# Patient Record
Sex: Female | Born: 1965 | Race: White | Hispanic: No
Health system: Midwestern US, Academic
[De-identification: ages and names within clinical notes are randomized; demographics above are authoritative.]

---

## 2016-10-21 ENCOUNTER — Encounter: Admit: 2016-10-21 | Discharge: 2016-10-21

## 2016-11-11 ENCOUNTER — Encounter: Admit: 2016-11-11 | Discharge: 2016-11-11

## 2016-11-11 ENCOUNTER — Ambulatory Visit: Admit: 2016-11-11 | Discharge: 2016-11-12

## 2016-11-11 DIAGNOSIS — J45909 Unspecified asthma, uncomplicated: Principal | ICD-10-CM

## 2016-11-11 DIAGNOSIS — R413 Other amnesia: ICD-10-CM

## 2016-11-11 DIAGNOSIS — I499 Cardiac arrhythmia, unspecified: ICD-10-CM

## 2016-11-11 DIAGNOSIS — O039 Complete or unspecified spontaneous abortion without complication: ICD-10-CM

## 2016-11-11 DIAGNOSIS — G43909 Migraine, unspecified, not intractable, without status migrainosus: ICD-10-CM

## 2016-11-11 DIAGNOSIS — M1990 Unspecified osteoarthritis, unspecified site: ICD-10-CM

## 2016-11-11 DIAGNOSIS — G479 Sleep disorder, unspecified: ICD-10-CM

## 2016-11-11 DIAGNOSIS — R079 Chest pain, unspecified: ICD-10-CM

## 2016-11-11 DIAGNOSIS — R1319 Other dysphagia: ICD-10-CM

## 2016-11-11 MED ORDER — TOPIRAMATE 25 MG PO TAB
ORAL_TABLET | Freq: Two times a day (BID) | 5 refills | Status: AC
Start: 2016-11-11 — End: 2017-05-05
  Filled 2016-11-11: qty 60, 20d supply

## 2016-11-11 NOTE — Progress Notes
Date of Service: 11/11/2016    Subjective:             Ragina Fenter is a 51 y.o. female with PMHx of sinusitis, chronic migraines, asthma, GERD, ovarian cysts, chronic pain who is a new patient at the [facility] neurology clinic for evaluation of chronic migraines.     History of Present Illness  Patient reports having headaches since teenage years, that started with mild-moderate headaches, and has progressively worsened to daily debilitating headaches. Patient describes them as squeezing pain, wrapped around her head that starts around 11 am and progressively gets worse. Patient reports associated nausea and vomiting from time to time, along with neck pain. Patient has noticed worsening symptoms with increased stress and decreased exercise. Patient has tried a number of different migraine medications, including amitriptyline, imitrex, topamax with no significant improvement in headaches. Patient has also tried nerve blocks and botox with no relief.        Review of Systems   Constitutional: Negative.    Neurological: Positive for headaches.       Objective:         ??? amitriptyline (ELAVIL) 100 mg tablet Take 200 mg by mouth at bedtime as needed.   ??? duloxetine DR (CYMBALTA) 30 mg capsule Take 30 mg by mouth twice daily.   ??? gabapentin (NEURONTIN) 600 mg tablet Take 600 mg by mouth three times daily.   ??? naproxen (NAPROSYN) 500 mg tablet Take 500 mg by mouth as Needed for Pain. Take with food.   ??? other medication Take 1 Dose by mouth twice daily. Medication Name: Sterile Compounded Capsules     Ingredients per Capsule:   Methylcobalamin 1mg    Pyridoxal-5-Phosphate 12.5mg   5-methyltetrahydrofolate 1mg   Riboflavin-5-Phosphate 200mg   Co Q-10 150mg   Magnesium Glycinate 300mg   Vitamin D3 2500 IU   ??? traMADol (ULTRAM) 50 mg tablet Take 50 mg by mouth every 12 hours as needed for Pain.     Vitals:    11/11/16 0910   BP: 105/70   Pulse: 93   Resp: 16   Weight: 65.1 kg (143 lb 8 oz)   Height: 160 cm (63) Body mass index is 25.42 kg/m???.     Physical Exam   Constitutional: She is oriented to person, place, and time. She appears well-developed and well-nourished.   HENT:   Head: Normocephalic and atraumatic.   Eyes: EOM are normal. Pupils are equal, round, and reactive to light.   With horizontal nystagmus    Neck: Normal range of motion.   Cardiovascular: Normal rate and regular rhythm.    Pulmonary/Chest: Effort normal.   Neurological: She is alert and oriented to person, place, and time. She has normal strength.   Reflex Scores:       Tricep reflexes are 2+ on the right side and 2+ on the left side.       Bicep reflexes are 2+ on the right side and 2+ on the left side.       Brachioradialis reflexes are 2+ on the right side and 2+ on the left side.       Patellar reflexes are 2+ on the right side and 2+ on the left side.  Finger to nose normal        Assessment and Plan:  Emil Klassen is a 51 y.o. female with PMHx of sinusitis, chronic migraines, asthma, GERD, ovarian cysts, chronic pain who is a new patient at the [facility] neurology clinic for evaluation of chronic migraines.     #  Chronic migraines  - likely tension headaches with migranous component   - mentions improvement in symptoms with CPAP, but still not able to be headache free  - continue amitriptyline 150 mg QHS   - add topiramate 25 mg BID for one week, 50 mg qAM, 25 mg qPM for one week, and then 50 mg BID after that  - relaxation techniques to help reduce stress    - f/u in 3 months     Patient seen and discussed with Dr. Genevie Cheshire

## 2016-11-11 NOTE — Progress Notes
Date of Service: 11/11/2016    Subjective:             Penny Townsend is a 51 y.o. female.    History of Present Illness       Review of Systems   Constitutional: Positive for fatigue.   HENT: Positive for congestion, ear pain, hearing loss, postnasal drip, sinus pain, sneezing, sore throat, trouble swallowing and voice change.    Eyes: Positive for photophobia, pain and itching.   Respiratory: Positive for apnea, cough, choking, chest tightness, shortness of breath and wheezing.    Cardiovascular: Positive for chest pain and palpitations.   Gastrointestinal: Positive for abdominal pain and vomiting.   Genitourinary: Positive for flank pain.   Musculoskeletal: Positive for back pain, myalgias, neck pain and neck stiffness.   Neurological: Positive for dizziness, speech difficulty, light-headedness and headaches.   Psychiatric/Behavioral: Positive for confusion and decreased concentration. The patient is nervous/anxious.    All other systems reviewed and are negative.        Objective:         ??? amitriptyline (ELAVIL) 100 mg tablet Take 200 mg by mouth at bedtime as needed.   ??? diazePAM (VALIUM) 10 mg tablet Take 10 mg by mouth as Needed.   ??? duloxetine DR (CYMBALTA) 30 mg capsule Take 30 mg by mouth twice daily.   ??? fexofenadine(+) (ALLEGRA) 180 mg tablet Take 180 mg by mouth daily.   ??? fluticasone (FLONASE) 50 mcg/actuation nasal spray Daily   ??? gabapentin (NEURONTIN) 600 mg tablet Take 600 mg by mouth three times daily.   ??? ketorolac (TORADOL) 10 mg tablet Take 10 mg by mouth as Needed.   ??? naproxen (NAPROSYN) 500 mg tablet Take 500 mg by mouth as Needed for Pain. Take with food.   ??? other medication Take 1 Dose by mouth twice daily. Medication Name: Sterile Compounded Capsules     Ingredients per Capsule:   Methylcobalamin 1mg    Pyridoxal-5-Phosphate 12.5mg   5-methyltetrahydrofolate 1mg   Riboflavin-5-Phosphate 200mg   Co Q-10 150mg   Magnesium Glycinate 300mg   Vitamin D3 2500 IU ??? traMADol (ULTRAM) 50 mg tablet Take 50 mg by mouth every 12 hours as needed for Pain.     Vitals:    11/11/16 0910   BP: 105/70   Pulse: 93   Resp: 16   Weight: 65.1 kg (143 lb 8 oz)   Height: 160 cm (63)     Body mass index is 25.42 kg/m???.     Physical Exam         Assessment and Plan:

## 2016-11-12 DIAGNOSIS — G43009 Migraine without aura, not intractable, without status migrainosus: Principal | ICD-10-CM

## 2016-11-12 NOTE — Progress Notes
I have personally interviewed and examined Penny Townsend and reviewed the history, examination, impression, and plan of care as outlined by Kateri Plummerr.Shweta Goswami, MD. I personally  participated in patient counseling and coordination of care.  I agree with the assessment and plan as documented by Dr. Fran LowesShweta Goswami, MD.

## 2016-11-22 ENCOUNTER — Encounter: Admit: 2016-11-22 | Discharge: 2016-11-22

## 2017-03-19 ENCOUNTER — Emergency Department
Admit: 2017-03-19 | Discharge: 2017-03-19 | Discharge status: Against Medical Advice | Payer: Commercial Managed Care - PPO

## 2017-03-19 ENCOUNTER — Encounter: Admit: 2017-03-19 | Discharge: 2017-03-19

## 2017-03-19 DIAGNOSIS — G43909 Migraine, unspecified, not intractable, without status migrainosus: ICD-10-CM

## 2017-03-19 DIAGNOSIS — O039 Complete or unspecified spontaneous abortion without complication: ICD-10-CM

## 2017-03-19 DIAGNOSIS — J45909 Unspecified asthma, uncomplicated: Principal | ICD-10-CM

## 2017-03-19 DIAGNOSIS — R413 Other amnesia: ICD-10-CM

## 2017-03-19 DIAGNOSIS — G479 Sleep disorder, unspecified: ICD-10-CM

## 2017-03-19 DIAGNOSIS — I499 Cardiac arrhythmia, unspecified: ICD-10-CM

## 2017-03-19 DIAGNOSIS — Z5321 Procedure and treatment not carried out due to patient leaving prior to being seen by health care provider: Principal | ICD-10-CM

## 2017-03-19 DIAGNOSIS — R1319 Other dysphagia: ICD-10-CM

## 2017-03-19 DIAGNOSIS — R079 Chest pain, unspecified: ICD-10-CM

## 2017-03-19 DIAGNOSIS — M1990 Unspecified osteoarthritis, unspecified site: ICD-10-CM

## 2017-03-21 ENCOUNTER — Encounter: Admit: 2017-03-21 | Discharge: 2017-03-21

## 2017-03-21 DIAGNOSIS — R0781 Pleurodynia: ICD-10-CM

## 2017-03-21 DIAGNOSIS — R079 Chest pain, unspecified: Principal | ICD-10-CM

## 2017-03-21 NOTE — Telephone Encounter
Patient called today noting that she has had chest pain and "burning" in her rib cage for a few weeks.  She saw her PCP 3 weeks ago approximately and she notes that she received very little direction at that time. No CXR was performed at that time.  She presented to the [place] ED on Saturday 11/24 but after a 2 hour wait left prior to being seen.  Discussed with Dr. Brown HumanSpikes who would like pt to get a CXR.  Patient amenable and will [facility] to [facility].    Order faxed to [phone]

## 2017-03-22 ENCOUNTER — Encounter: Admit: 2017-03-22 | Discharge: 2017-03-22

## 2017-03-22 NOTE — Telephone Encounter
Per Dr. Brown HumanSpikes, patient's CXR is negative and patient should schedule a f/u appt with her cardiologist as well as she needs an echocardiogram for her symptoms.    LVM @ 1446 for patient with request to call back to discuss

## 2017-03-23 ENCOUNTER — Encounter: Admit: 2017-03-23 | Discharge: 2017-03-23

## 2017-03-23 NOTE — Telephone Encounter
Notified patient of Dr. Nance Pew' recomemndations, patient v/u    LVM @ 1007 for [place], with Dr. Starleen Blue in [facility] to discuss patient

## 2017-03-23 NOTE — Telephone Encounter
Spoke with Arrie Aran, Dr. Kendrick Ranch nurse at [facility]; advised Dawn of Dr. Nance Pew' recommendation of a follow up appt with Dr. Starleen Blue and he may need/want a possible echo.  Dawn v/u and will relay the message.

## 2017-03-28 ENCOUNTER — Ambulatory Visit: Admit: 2017-03-28 | Discharge: 2017-03-28

## 2017-03-28 ENCOUNTER — Ambulatory Visit: Admit: 2017-03-28 | Discharge: 2017-03-28 | Payer: Commercial Managed Care - PPO

## 2017-03-28 ENCOUNTER — Encounter: Admit: 2017-03-28 | Discharge: 2017-03-28

## 2017-03-28 DIAGNOSIS — I313 Pericardial effusion (noninflammatory): Principal | ICD-10-CM

## 2017-03-28 DIAGNOSIS — R413 Other amnesia: ICD-10-CM

## 2017-03-28 DIAGNOSIS — R079 Chest pain, unspecified: ICD-10-CM

## 2017-03-28 DIAGNOSIS — R1319 Other dysphagia: ICD-10-CM

## 2017-03-28 DIAGNOSIS — J45909 Unspecified asthma, uncomplicated: Principal | ICD-10-CM

## 2017-03-28 DIAGNOSIS — G43909 Migraine, unspecified, not intractable, without status migrainosus: ICD-10-CM

## 2017-03-28 DIAGNOSIS — O039 Complete or unspecified spontaneous abortion without complication: ICD-10-CM

## 2017-03-28 DIAGNOSIS — I300 Acute nonspecific idiopathic pericarditis: ICD-10-CM

## 2017-03-28 DIAGNOSIS — I499 Cardiac arrhythmia, unspecified: ICD-10-CM

## 2017-03-28 DIAGNOSIS — G479 Sleep disorder, unspecified: ICD-10-CM

## 2017-03-28 DIAGNOSIS — M1990 Unspecified osteoarthritis, unspecified site: ICD-10-CM

## 2017-03-28 LAB — CBC
Lab: 3.9 M/UL — ABNORMAL LOW (ref 4.0–5.0)
Lab: 310 10*3/uL (ref 150–400)
Lab: 7.5 FL (ref 7–11)
Lab: 7.7 10*3/uL — ABNORMAL LOW (ref 4.5–11.0)

## 2017-03-28 LAB — LIPID PROFILE
Lab: 122 mg/dL — ABNORMAL HIGH (ref <100)
Lab: 14 mg/dL (ref 32.0–36.0)
Lab: 141 mg/dL (ref 11–15)
Lab: 222 mg/dL — ABNORMAL HIGH (ref <200)
Lab: 68 mg/dL (ref <150)
Lab: 81 mg/dL — ABNORMAL HIGH (ref >40)

## 2017-03-28 NOTE — Progress Notes
Date of Service: 03/28/2017    Penny Townsend is a 51 y.o. female.       HPI      Penny Townsend presents today for evaluation at the request of her pulmonologist Dr. Verlon Au spikes who received a phone call about Penny Townsend having chest pain.     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  She had an extensive immunologic workup which was negative.  She was hospitalized for volume overload in December 2017 at [facility].  At the end of that hospitalization she had a stress test that showed no residual effusion and no evidence of ischemia with limited exercise tolerance due to shortness of breath.      The key elements of that stress test are as follows:  Stress Echo12.22. 2017: Baseline Echo,1.  Normal left ventricular function, EF 55%,2.  Normal chamber dimensions,3.  Valve structure and function are normal,4.  Normal diastolic functionStress ECG,1.  Limited exercise due to patient being short of breath2.  Subtle T wave inversions in the inferior leads with no ST segment changes 3.  No significant atrial or ventricular ectopy during rest or stress4.  Nondiagnostic - submaximal stress ECG, Post Exercise Echo1.  Hyperdynamic left ventricular response to the level of exercise2.  Ejection fraction increases with exercise3.  Left ventricular volume decreased4.  No new regional wall motion abnormalities details of the performance include a 2-minute 43-second exercise time attaining 79% of age corrected target heart rate with a peak of 132    In the next month after she was treated for volume overload she became dehydrated and was readmitted on April 26, 2016 with fluid readministration resolving her issue.  Since then she has made one trip to the emergency room.  That was actually on November 24 when she came to [place] at her husband's insistence because of burning pain in her ribs.  She waited 2-1/2 hours and then decided to leave because she did not think the acuity of her illness was great enough to warrant protracted waiting.  She did not even get checked in enough with her rib pain to have an electrocardiogram.  She did see her primary care physician Dr. Dionne Milo on the following Monday.  She tells me that a chest x-ray was done as well as an EKG.  She heard nothing about either 1 of these and presumes that both were unremarkable.      Penny Townsend actually has several types of chest pain.  She went in to the emergency room because of the more severe pain which is bilaterally symmetric and involves the lower third of her rib cage in the mid axillary line.  Some of this may change with breathing.  This is fairly severe sharp pain she also has some discomfort in the midline of her lower anterior chest that may be more noticeable when she lays down.  She noticed a little bit of this when I had her lay flat for exam today.    The third type of chest discomfort she has is the one that is most disturbing to me with regard to possible coronary disease she describes this as a burning sensation bilaterally across the upper third of her chest radiating evenly from the sternal region toward the shoulders.  It occurs fairly predictably with physical activity and lasts long enough to make her stop or slow down from what she is doing.  The first episode was several weeks ago when  she was carrying something up the stairs.  She states that she was not carrying anything particularly heavy.  This discomfort has continued principally with exertion.  She has not had any dramatically prolonged or more severe episodes within the last week.    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  She has had an extensive immunologic panel run with no diagnostic findings.  In the absence of a diagnosis she does not have follow-up scheduled with him.  She is not had symptoms of serositis she is having no fevers chills or sweats not having hemoptysis.  Her medications are stable.  She is on no cardioactive medications at this time.  When I have seen her in the past we addressed issues related to unexplained low-grade sinus tachycardia.  She is been treated with propranolol for recurrent headaches which I thought was suppressing her heart rate in the might be reasonable can medication to continue.  However she is not on the propranolol at this time.  The resting heart rate she has today 107 does not surprise her at all.         Vitals:    03/28/17 0855   BP: 102/70   Pulse: 107   Weight: 68 kg (150 lb)   Height: 1.651 m (5' 5)     Body mass index is 24.96 kg/m???.     Past Medical History  Patient Active Problem List    Diagnosis Date Noted   ??? Migraine headache 11/11/2016   ??? Migraine without aura and without status migrainosus, not intractable 07/06/2016     Dx's ~21002 then followed by Dr. Adair Laundry with Propranolol.      ??? 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 HAP (hospital-acquired pneumonia) 04/10/2016   ??? Generalized edema 04/10/2016   ??? Normocytic anemia 04/10/2016   ??? Thrombocytosis (HCC) 04/10/2016   ??? Bilateral pleural effusion 04/10/2016   ??? Constipation 04/10/2016   ??? Pericardial effusion 04/10/2016         Review of Systems   Constitution: Negative.   HENT: Negative.    Eyes: Negative.    Cardiovascular: Positive for chest pain (a little) and dyspnea on exertion.   Respiratory: Positive for shortness of breath.    Endocrine: Negative.    Hematologic/Lymphatic: Negative.    Skin: Negative.    Musculoskeletal: Negative.    Gastrointestinal: Negative.    Genitourinary: Negative.    Neurological: Negative.    Psychiatric/Behavioral: Negative.    Allergic/Immunologic: Negative.    All other systems reviewed and are negative.  14 organ system review noted. It is negative except as reported in current narrative or above in the ROS section. This is a patient centered review of systems that was stated by the patient in her terms prior to my personal problem oriented interview with the patient     Physical Exam  General: Patient in no distress, looks generally healthy. Skin warm and dry.    Mucous membranes moist.  Eyes: Sclera non icteric,Pupils equal and round    Carotids: no bruits    Thyroid not enlarged.  Neck veins: CVP <6 normal, no V wave, no HJR     Respiratory: Breathing comfortably. Lungs clear to percussion & auscultation. No rales, rhonchi or wheezing   Cardiac: Regular rhythm. LV impulse not palpable. Normal S1 & S2, Fourth heart sound, no rub or S3. No murmur  Abdomen: soft, non-tender, no  masses,bruits,hepatic or aortic enlargement. + bowel sounds.   Femoral arteries: Good pulses, no bruits.  Legs/feet: Normal PT pulses, no edema.   Motor: Normal muscle strength. Cognitive: Pleasant demeanor. Good insight. No depression     Cardiovascular Studies  EKG today shows sinus tachycardia with less than 1/65mm horizontal and slightly upsloping ST depression in the inferior and lateral leads that is nonspecific.  I reviewed all her tracings on file.  Her last tracing showed ST depression similar to what was seen today.  Other tracings show some nonspecific T inversion rather than ST segment depression.  All of these findings are nonspecific.  Problems Addressed Today  No diagnosis found.    Assessment and Plan     Penny Townsend has multiple types of chest discomfort but 1 of them is characteristic enough of angina to warrant stress testing.  Even though she had a benign stress test last December she only exercised 2 minutes 43 seconds reaching 79% of her age corrected target heart rate.  With issues relating to possible recurrent pericardial effusion and baseline ST and T wave changes I think she needs another treadmill exercise echo.  I am going to let her do a warmup before she moves into the Bruce protocol so she will have a modified Bruce protocol treadmill echo.  I would like her to do this at our [place] office where I have ready access to the images and the report.  I had like to get this done fairly expeditiously although it is not an emergency.  I talked to her about her exercise duration last year she thought that she could do much better this year.  Nevertheless I still want her to do a modified Bruce protocol so she has some time to warm up before hitting the more challenging components of the stress protocol.    The echo will help make sure that we do not have pericardial effusion.  Her lungs are clear.  I do not think she has a pleural effusion by exam.  She had a chest x-ray last week in [place] when she saw Dr. Herschell Dimes so I am confident that there is nothing to be gained by having another chest x-ray.    I do not think she needs further immunologic evaluation particularly under my direction.    I actually would be surprised if she does have coronary disease evident on her stress echo given her age of 52 but we need to make sure since she does have symptoms of exertion triggered   upper anterior chest pressure that are suggestive of angina.    I told her she can have this done in [place] at her convenience.  She will return to have the modified Bruce stress echo without Doppler done in our  office sometime in the next week or so.    I am not starting her on any medication as I am not certain of the diagnosis.    Blood pressure looks good.  Heart rate is a little high as it has been in the past.  At this point I do not think there is any way clearly should be doing.  You had your thyroid checked multiple times sure.    She has a history of pernicious anemia.  I want her to get a CBC as well as a fasting lipid profile in order to get a better assessment of her potential causes for resting tachycardia. She is due for a cholesterol profile.  She has had minimal amount  to eat today.  I think I would be better off just checking a lipid profile today as well as a CBC to be certain that she is not anemic as a cause for resting tachycardia or as a contributor to chest discomfort syndrome.   Lab work I ordered as a result of today's visit has returned and is pasted below.  Her hemoglobin is satisfactory eliminating anemia as a cause of her symptoms.  Her LDL is elevated at 122 but I am not sure this warrants therapy given her overall risk profile including the fact that she is a 51 year old woman.   03/28/2017 10:24   Hemoglobin 13.1   Hematocrit 39.0   Platelet Count 310   White Blood Cells 7.7   RBC 3.92 (L)   MCV 99.6   MCH 33.5   MCHC 33.6   MPV 7.5   RDW 13.5   Cholesterol 222 (H)   Triglycerides 68   HDL 81   LDL 122 (H)       NB: The free text in this document was generated through Dragon(TM) software with editing and proofreading  done by the author of this document Dr. Mable Paris MD, [facility] principally at the point of care. Some errors may persist.  If there are questions about content in this document please contact Dr. Hale Bogus.    The written information I provided Penny Townsend at the conclusion of today's encounter is as  follows:    Patient Instructions   Even though you had a treadmill stress test last year your symptoms of chest pressure with exertion are concerning enough that I think you need to have a repeat stress echo.  There is a possibility that pericardial effusion has returned and is explaining some of your chest symptoms that are not related to exertion.  I think the best thing for you is to have a modified Bruce stress echo study without Doppler because we will be able to compare it to the study you had a year ago.    Can start you on any medication today since I am really not sure what you have.  If your chest pain worsens dramatically between now and the time he has a stress test he probably need to go the emergency room in [place] or possibly [place] depending on the situation.  Another waiting time at [facility] is almost always going to be less than here. Blood testing today: CBC and fasting cholesterol profile today.  I realize you are not quite fasting but I think at this point is better to get the results and know what we are basically dealing with.  There is a segment of research that suggests that nonfasting cholesterol profile is as good as a fasting so your situation I think is reasonable to check today    It's good to see you today.   I will do my best to contact you after the stress test.  The thing I have the greatest trouble with his looking up results when they are just fed into the computer and is up to me to track them down    If you do not hear anything about your stress test result within a week use My Chart to send a message to me requesting results.               Current Medications (including today's revisions)  ??? amitriptyline (ELAVIL) 100 mg tablet Take 200 mg by mouth at bedtime as needed.   ???  diazePAM (VALIUM) 10 mg tablet Take 10 mg by mouth as Needed.   ??? duloxetine DR (CYMBALTA) 30 mg capsule Take 30 mg by mouth twice daily.   ??? fexofenadine(+) (ALLEGRA) 180 mg tablet Take 180 mg by mouth daily.   ??? fluticasone (FLONASE) 50 mcg/actuation nasal spray Daily   ??? gabapentin (NEURONTIN) 600 mg tablet Take 600 mg by mouth three times daily.   ??? ketorolac (TORADOL) 10 mg tablet Take 10 mg by mouth as Needed.   ??? naproxen (NAPROSYN) 500 mg tablet Take 500 mg by mouth as Needed for Pain. Take with food.   ??? other medication Take 1 Dose by mouth twice daily. Medication Name: Sterile Compounded Capsules     Ingredients per Capsule:   Methylcobalamin 1mg    Pyridoxal-5-Phosphate 12.5mg   5-methyltetrahydrofolate 1mg   Riboflavin-5-Phosphate 200mg   Co Q-10 150mg   Magnesium Glycinate 300mg   Vitamin D3 2500 IU   ??? topiramate (TOPAMAX) 25 mg tablet Take 25 mg tablet two times a day for one week, then 50 mg (two tabs) in the morning and 25 mg (one tab) in the evening for one week, then 50 mg (two tabs) in the morning and 50 mg (two tabs) in the evening ??? traMADol (ULTRAM) 50 mg tablet Take 50 mg by mouth every 12 hours as needed for Pain.

## 2017-03-31 ENCOUNTER — Encounter: Admit: 2017-03-31 | Discharge: 2017-03-31

## 2017-04-01 ENCOUNTER — Encounter: Admit: 2017-04-01 | Discharge: 2017-04-01

## 2017-04-28 ENCOUNTER — Encounter: Admit: 2017-04-28 | Discharge: 2017-04-28

## 2017-04-29 ENCOUNTER — Ambulatory Visit: Admit: 2017-04-29 | Discharge: 2017-04-29 | Payer: Commercial Managed Care - PPO

## 2017-04-29 ENCOUNTER — Encounter: Admit: 2017-04-29 | Discharge: 2017-04-29

## 2017-04-29 DIAGNOSIS — I313 Pericardial effusion (noninflammatory): Principal | ICD-10-CM

## 2017-04-29 DIAGNOSIS — I300 Acute nonspecific idiopathic pericarditis: ICD-10-CM

## 2017-05-05 ENCOUNTER — Encounter: Admit: 2017-05-05 | Discharge: 2017-05-05

## 2017-05-05 ENCOUNTER — Ambulatory Visit: Admit: 2017-05-05 | Discharge: 2017-05-05 | Payer: Commercial Managed Care - PPO

## 2017-05-05 DIAGNOSIS — K219 Gastro-esophageal reflux disease without esophagitis: Principal | ICD-10-CM

## 2017-05-05 DIAGNOSIS — G479 Sleep disorder, unspecified: ICD-10-CM

## 2017-05-05 DIAGNOSIS — R1114 Bilious vomiting: ICD-10-CM

## 2017-05-05 DIAGNOSIS — K5900 Constipation, unspecified: ICD-10-CM

## 2017-05-05 DIAGNOSIS — J45909 Unspecified asthma, uncomplicated: Principal | ICD-10-CM

## 2017-05-05 DIAGNOSIS — R1319 Other dysphagia: ICD-10-CM

## 2017-05-05 DIAGNOSIS — I499 Cardiac arrhythmia, unspecified: ICD-10-CM

## 2017-05-05 DIAGNOSIS — R079 Chest pain, unspecified: ICD-10-CM

## 2017-05-05 DIAGNOSIS — M1990 Unspecified osteoarthritis, unspecified site: ICD-10-CM

## 2017-05-05 DIAGNOSIS — K9289 Other specified diseases of the digestive system: Principal | ICD-10-CM

## 2017-05-05 DIAGNOSIS — O039 Complete or unspecified spontaneous abortion without complication: ICD-10-CM

## 2017-05-05 DIAGNOSIS — R413 Other amnesia: ICD-10-CM

## 2017-05-05 DIAGNOSIS — G43909 Migraine, unspecified, not intractable, without status migrainosus: ICD-10-CM

## 2017-05-05 DIAGNOSIS — R1012 Left upper quadrant pain: ICD-10-CM

## 2017-05-05 MED ORDER — PANTOPRAZOLE 40 MG PO TBEC
40 mg | ORAL_TABLET | Freq: Two times a day (BID) | ORAL | 2 refills | 90.00000 days | Status: AC
Start: 2017-05-05 — End: 2017-05-10

## 2017-05-05 MED ORDER — SODIUM CHLORIDE 0.9 % IV SOLP
INTRAVENOUS | 0 refills | Status: CN
Start: 2017-05-05 — End: ?

## 2017-05-06 ENCOUNTER — Encounter: Admit: 2017-05-06 | Discharge: 2017-05-06

## 2017-05-06 DIAGNOSIS — R1319 Other dysphagia: ICD-10-CM

## 2017-05-06 DIAGNOSIS — K9289 Other specified diseases of the digestive system: Principal | ICD-10-CM

## 2017-05-06 DIAGNOSIS — K219 Gastro-esophageal reflux disease without esophagitis: ICD-10-CM

## 2017-05-06 DIAGNOSIS — R1012 Left upper quadrant pain: ICD-10-CM

## 2017-05-10 MED ORDER — OMEPRAZOLE 40 MG PO CPDR
ORAL_CAPSULE | Freq: Two times a day (BID) | 2 refills | Status: AC
Start: 2017-05-10 — End: 2017-09-26

## 2017-05-11 ENCOUNTER — Encounter: Admit: 2017-05-11 | Discharge: 2017-05-11

## 2017-05-11 ENCOUNTER — Ambulatory Visit: Admit: 2017-05-11 | Discharge: 2017-05-11

## 2017-05-11 ENCOUNTER — Ambulatory Visit: Admit: 2017-05-11 | Discharge: 2017-05-11 | Payer: Commercial Managed Care - PPO

## 2017-05-11 DIAGNOSIS — R0789 Other chest pain: ICD-10-CM

## 2017-05-11 DIAGNOSIS — R1319 Other dysphagia: ICD-10-CM

## 2017-05-11 DIAGNOSIS — G43909 Migraine, unspecified, not intractable, without status migrainosus: ICD-10-CM

## 2017-05-11 DIAGNOSIS — R1012 Left upper quadrant pain: ICD-10-CM

## 2017-05-11 DIAGNOSIS — J45909 Unspecified asthma, uncomplicated: Principal | ICD-10-CM

## 2017-05-11 DIAGNOSIS — O039 Complete or unspecified spontaneous abortion without complication: ICD-10-CM

## 2017-05-11 DIAGNOSIS — M1990 Unspecified osteoarthritis, unspecified site: ICD-10-CM

## 2017-05-11 DIAGNOSIS — K5900 Constipation, unspecified: Principal | ICD-10-CM

## 2017-05-11 DIAGNOSIS — K219 Gastro-esophageal reflux disease without esophagitis: ICD-10-CM

## 2017-05-11 DIAGNOSIS — K9289 Other specified diseases of the digestive system: ICD-10-CM

## 2017-05-11 DIAGNOSIS — R1114 Bilious vomiting: ICD-10-CM

## 2017-05-11 DIAGNOSIS — R079 Chest pain, unspecified: ICD-10-CM

## 2017-05-11 DIAGNOSIS — R413 Other amnesia: ICD-10-CM

## 2017-05-11 DIAGNOSIS — I499 Cardiac arrhythmia, unspecified: ICD-10-CM

## 2017-05-11 DIAGNOSIS — G479 Sleep disorder, unspecified: ICD-10-CM

## 2017-05-11 LAB — CBC
Lab: 100 FL — ABNORMAL HIGH (ref 80–100)
Lab: 4 M/UL (ref 4.0–5.0)
Lab: 40 % (ref 36–45)
Lab: 6.6 K/UL (ref 4.5–11.0)

## 2017-05-11 LAB — TSH WITH FREE T4 REFLEX: Lab: 1.8 uU/mL (ref 0.35–5.00)

## 2017-05-11 LAB — COMPREHENSIVE METABOLIC PANEL
Lab: 140 MMOL/L (ref 137–147)
Lab: 3.7 MMOL/L (ref 3.5–5.1)

## 2017-05-11 LAB — C REACTIVE PROTEIN (CRP): Lab: 0.6 mg/dL (ref <1.0)

## 2017-05-11 LAB — CORTISOL-AM: Lab: 8 ug/dL (ref 6.7–22.6)

## 2017-05-11 LAB — LIPASE: Lab: 23 U/L (ref 11–82)

## 2017-05-11 LAB — SED RATE: Lab: 8 mm/h (ref 0–30)

## 2017-05-11 LAB — AMYLASE: Lab: 34 U/L (ref 24–100)

## 2017-05-18 ENCOUNTER — Ambulatory Visit: Admit: 2017-05-18 | Discharge: 2017-05-18 | Payer: Commercial Managed Care - PPO

## 2017-05-18 ENCOUNTER — Encounter: Admit: 2017-05-18 | Discharge: 2017-05-18

## 2017-05-18 DIAGNOSIS — K5900 Constipation, unspecified: ICD-10-CM

## 2017-05-18 DIAGNOSIS — K9289 Other specified diseases of the digestive system: Secondary | ICD-10-CM

## 2017-05-18 DIAGNOSIS — R1012 Left upper quadrant pain: ICD-10-CM

## 2017-05-18 DIAGNOSIS — R1319 Other dysphagia: ICD-10-CM

## 2017-05-18 DIAGNOSIS — R1114 Bilious vomiting: Principal | ICD-10-CM

## 2017-05-18 MED ORDER — RP DX TC-99M SULF COLL MCI
1 | Freq: Once | INTRAVENOUS | 0 refills | Status: CP
Start: 2017-05-18 — End: ?
  Administered 2017-05-18: 17:00:00 1 via INTRAVENOUS

## 2017-05-19 MED ORDER — PEG-ELECTROLYTE SOLN 420 GRAM PO SOLR
0 refills | Status: AC
Start: 2017-05-19 — End: 2017-07-20

## 2017-05-20 ENCOUNTER — Encounter: Admit: 2017-05-20 | Discharge: 2017-05-20

## 2017-05-24 ENCOUNTER — Ambulatory Visit: Admit: 2017-05-24 | Discharge: 2017-05-24 | Payer: Commercial Managed Care - PPO

## 2017-05-24 ENCOUNTER — Encounter: Admit: 2017-05-24 | Discharge: 2017-05-24

## 2017-05-24 DIAGNOSIS — J45909 Unspecified asthma, uncomplicated: Principal | ICD-10-CM

## 2017-05-24 DIAGNOSIS — R1319 Other dysphagia: ICD-10-CM

## 2017-05-24 DIAGNOSIS — R413 Other amnesia: ICD-10-CM

## 2017-05-24 DIAGNOSIS — R140 Abdominal distension (gaseous): Principal | ICD-10-CM

## 2017-05-24 DIAGNOSIS — I499 Cardiac arrhythmia, unspecified: ICD-10-CM

## 2017-05-24 DIAGNOSIS — O039 Complete or unspecified spontaneous abortion without complication: ICD-10-CM

## 2017-05-24 DIAGNOSIS — G479 Sleep disorder, unspecified: ICD-10-CM

## 2017-05-24 DIAGNOSIS — M1990 Unspecified osteoarthritis, unspecified site: ICD-10-CM

## 2017-05-24 DIAGNOSIS — R079 Chest pain, unspecified: ICD-10-CM

## 2017-05-24 DIAGNOSIS — K5900 Constipation, unspecified: ICD-10-CM

## 2017-05-24 DIAGNOSIS — G43909 Migraine, unspecified, not intractable, without status migrainosus: ICD-10-CM

## 2017-06-01 ENCOUNTER — Encounter: Admit: 2017-06-01 | Discharge: 2017-06-01

## 2017-06-15 ENCOUNTER — Ambulatory Visit: Admit: 2017-06-15 | Discharge: 2017-06-15 | Payer: Commercial Managed Care - PPO

## 2017-06-15 ENCOUNTER — Encounter: Admit: 2017-06-15 | Discharge: 2017-06-15 | Payer: Commercial Managed Care - PPO

## 2017-06-15 DIAGNOSIS — Z1211 Encounter for screening for malignant neoplasm of colon: Principal | ICD-10-CM

## 2017-06-15 DIAGNOSIS — K219 Gastro-esophageal reflux disease without esophagitis: ICD-10-CM

## 2017-06-16 ENCOUNTER — Encounter: Admit: 2017-06-16 | Discharge: 2017-06-16

## 2017-06-17 ENCOUNTER — Encounter: Admit: 2017-06-17 | Discharge: 2017-06-17

## 2017-06-20 ENCOUNTER — Encounter: Admit: 2017-06-20 | Discharge: 2017-06-20

## 2017-06-24 ENCOUNTER — Encounter: Admit: 2017-06-24 | Discharge: 2017-06-24

## 2017-07-19 ENCOUNTER — Encounter: Admit: 2017-07-19 | Discharge: 2017-07-19

## 2017-07-20 ENCOUNTER — Ambulatory Visit: Admit: 2017-07-20 | Discharge: 2017-07-20 | Payer: Commercial Managed Care - PPO

## 2017-07-20 ENCOUNTER — Encounter: Admit: 2017-07-20 | Discharge: 2017-07-20

## 2017-07-20 ENCOUNTER — Ambulatory Visit: Admit: 2017-07-20 | Discharge: 2017-07-21

## 2017-07-20 DIAGNOSIS — R1319 Other dysphagia: ICD-10-CM

## 2017-07-20 DIAGNOSIS — K219 Gastro-esophageal reflux disease without esophagitis: ICD-10-CM

## 2017-07-20 DIAGNOSIS — R1114 Bilious vomiting: Principal | ICD-10-CM

## 2017-07-20 DIAGNOSIS — K5900 Constipation, unspecified: ICD-10-CM

## 2017-07-20 DIAGNOSIS — Z7689 Persons encountering health services in other specified circumstances: ICD-10-CM

## 2017-07-20 DIAGNOSIS — I499 Cardiac arrhythmia, unspecified: ICD-10-CM

## 2017-07-20 DIAGNOSIS — K9289 Other specified diseases of the digestive system: ICD-10-CM

## 2017-07-20 DIAGNOSIS — R413 Other amnesia: ICD-10-CM

## 2017-07-20 DIAGNOSIS — G43909 Migraine, unspecified, not intractable, without status migrainosus: ICD-10-CM

## 2017-07-20 DIAGNOSIS — O039 Complete or unspecified spontaneous abortion without complication: ICD-10-CM

## 2017-07-20 DIAGNOSIS — M1990 Unspecified osteoarthritis, unspecified site: ICD-10-CM

## 2017-07-20 DIAGNOSIS — R768 Other specified abnormal immunological findings in serum: ICD-10-CM

## 2017-07-20 DIAGNOSIS — R1012 Left upper quadrant pain: ICD-10-CM

## 2017-07-20 DIAGNOSIS — G479 Sleep disorder, unspecified: ICD-10-CM

## 2017-07-20 DIAGNOSIS — K2960 Other gastritis without bleeding: ICD-10-CM

## 2017-07-20 DIAGNOSIS — R140 Abdominal distension (gaseous): ICD-10-CM

## 2017-07-20 DIAGNOSIS — J45909 Unspecified asthma, uncomplicated: Principal | ICD-10-CM

## 2017-07-20 DIAGNOSIS — R079 Chest pain, unspecified: ICD-10-CM

## 2017-07-20 LAB — CBC AND DIFF
Lab: 0 % (ref 0–2)
Lab: 100 FL — ABNORMAL HIGH (ref 80–100)
Lab: 13 g/dL (ref 12.0–15.0)
Lab: 20 % — ABNORMAL LOW (ref 24–44)
Lab: 3.9 M/UL — ABNORMAL LOW (ref 4.0–5.0)
Lab: 33 g/dL (ref 32.0–36.0)
Lab: 33 pg (ref 26–34)
Lab: 39 % (ref 36–45)
Lab: 6 % (ref 4–12)
Lab: 7.3 10*3/uL (ref 4.5–11.0)

## 2017-07-20 LAB — GGTP: Lab: 18 U/L (ref 9–64)

## 2017-07-20 LAB — FOLATE, SERUM: Lab: >23 ng/mL (ref >3.9)

## 2017-07-20 LAB — LIVER FUNCTION PANEL: Lab: 0.3 mg/dL (ref 0.3–1.2)

## 2017-07-20 MED ORDER — HYOSCYAMINE SULFATE 0.125 MG PO TBDI
125 ug | ORAL_TABLET | SUBLINGUAL | 0 refills | Status: AC | PRN
Start: 2017-07-20 — End: 2018-04-14

## 2017-07-20 MED ORDER — BREEZA FOR NEUTRAL ABDOMINAL/PELVIC IMAGING PO SOLN
1500 mL | Freq: Once | ORAL | 0 refills | Status: CP
Start: 2017-07-20 — End: ?

## 2017-07-20 MED ORDER — GLUCAGON HCL 1 MG IJ SOLR
1 [IU] | Freq: Once | INTRAMUSCULAR | 0 refills | Status: CP
Start: 2017-07-20 — End: ?
  Administered 2017-07-20: 19:00:00 1 mg via INTRAMUSCULAR

## 2017-07-20 MED ORDER — SODIUM CHLORIDE 0.9 % IJ SOLN
50 mL | Freq: Once | INTRAVENOUS | 0 refills | Status: CP
Start: 2017-07-20 — End: ?
  Administered 2017-07-20: 20:00:00 50 mL via INTRAVENOUS

## 2017-07-20 MED ORDER — IOHEXOL 350 MG IODINE/ML IV SOLN
100 mL | Freq: Once | INTRAVENOUS | 0 refills | Status: CP
Start: 2017-07-20 — End: ?
  Administered 2017-07-20: 20:00:00 100 mL via INTRAVENOUS

## 2017-07-21 ENCOUNTER — Encounter: Admit: 2017-07-21 | Discharge: 2017-07-21

## 2017-07-21 LAB — 25-OH VITAMIN D (D2 + D3): Lab: 46 ng/mL (ref 30–80)

## 2017-07-21 MED ORDER — PEG-ELECTROLYTE SOLN 420 GRAM PO SOLR
0 refills | Status: AC
Start: 2017-07-21 — End: 2017-07-22

## 2017-07-22 ENCOUNTER — Encounter: Admit: 2017-07-22 | Discharge: 2017-07-22

## 2017-07-22 DIAGNOSIS — E569 Vitamin deficiency, unspecified: Principal | ICD-10-CM

## 2017-07-22 MED ORDER — PEG-ELECTROLYTE SOLN 420 GRAM PO SOLR
0 refills | Status: AC
Start: 2017-07-22 — End: 2017-12-30

## 2017-07-28 ENCOUNTER — Encounter: Admit: 2017-07-28 | Discharge: 2017-07-28

## 2017-07-28 DIAGNOSIS — G43909 Migraine, unspecified, not intractable, without status migrainosus: ICD-10-CM

## 2017-07-28 DIAGNOSIS — O039 Complete or unspecified spontaneous abortion without complication: ICD-10-CM

## 2017-07-28 DIAGNOSIS — R1319 Other dysphagia: ICD-10-CM

## 2017-07-28 DIAGNOSIS — M1990 Unspecified osteoarthritis, unspecified site: ICD-10-CM

## 2017-07-28 DIAGNOSIS — G479 Sleep disorder, unspecified: ICD-10-CM

## 2017-07-28 DIAGNOSIS — R079 Chest pain, unspecified: ICD-10-CM

## 2017-07-28 DIAGNOSIS — J45909 Unspecified asthma, uncomplicated: Principal | ICD-10-CM

## 2017-07-28 DIAGNOSIS — R413 Other amnesia: ICD-10-CM

## 2017-07-28 DIAGNOSIS — I499 Cardiac arrhythmia, unspecified: ICD-10-CM

## 2017-09-26 ENCOUNTER — Encounter: Admit: 2017-09-26 | Discharge: 2017-09-26

## 2017-09-26 ENCOUNTER — Ambulatory Visit: Admit: 2017-09-26 | Discharge: 2017-09-26 | Payer: Commercial Managed Care - PPO

## 2017-09-26 DIAGNOSIS — R079 Chest pain, unspecified: ICD-10-CM

## 2017-09-26 DIAGNOSIS — K5904 Chronic idiopathic constipation: Principal | ICD-10-CM

## 2017-09-26 DIAGNOSIS — G43909 Migraine, unspecified, not intractable, without status migrainosus: ICD-10-CM

## 2017-09-26 DIAGNOSIS — M1990 Unspecified osteoarthritis, unspecified site: ICD-10-CM

## 2017-09-26 DIAGNOSIS — J45909 Unspecified asthma, uncomplicated: Principal | ICD-10-CM

## 2017-09-26 DIAGNOSIS — O039 Complete or unspecified spontaneous abortion without complication: ICD-10-CM

## 2017-09-26 DIAGNOSIS — R413 Other amnesia: ICD-10-CM

## 2017-09-26 DIAGNOSIS — G479 Sleep disorder, unspecified: ICD-10-CM

## 2017-09-26 DIAGNOSIS — I499 Cardiac arrhythmia, unspecified: ICD-10-CM

## 2017-09-26 DIAGNOSIS — R1319 Other dysphagia: ICD-10-CM

## 2017-09-26 MED ORDER — OMEPRAZOLE 40 MG PO CPDR
ORAL_CAPSULE | Freq: Every day | 2 refills | Status: AC
Start: 2017-09-26 — End: 2018-01-25

## 2017-09-26 MED ORDER — CALCIUM POLYCARBOPHIL 625 MG PO TAB
1250 mg | ORAL_TABLET | Freq: Every day | ORAL | 3 refills | Status: AC
Start: 2017-09-26 — End: 2018-04-14

## 2017-09-26 MED ORDER — POLYETHYLENE GLYCOL 3350 17 GRAM/DOSE PO POWD
3 refills | 30.00000 days | Status: AC
Start: 2017-09-26 — End: 2018-04-14

## 2017-10-05 ENCOUNTER — Encounter: Admit: 2017-10-05 | Discharge: 2017-10-05

## 2017-10-14 ENCOUNTER — Encounter: Admit: 2017-10-14 | Discharge: 2017-10-14

## 2017-10-14 ENCOUNTER — Ambulatory Visit: Admit: 2017-10-14 | Discharge: 2017-10-14 | Payer: Commercial Managed Care - PPO

## 2017-10-14 ENCOUNTER — Ambulatory Visit: Admit: 2017-10-14 | Discharge: 2017-10-14

## 2017-10-14 DIAGNOSIS — I499 Cardiac arrhythmia, unspecified: ICD-10-CM

## 2017-10-14 DIAGNOSIS — D721 Eosinophilia: ICD-10-CM

## 2017-10-14 DIAGNOSIS — M1990 Unspecified osteoarthritis, unspecified site: ICD-10-CM

## 2017-10-14 DIAGNOSIS — R413 Other amnesia: ICD-10-CM

## 2017-10-14 DIAGNOSIS — R079 Chest pain, unspecified: ICD-10-CM

## 2017-10-14 DIAGNOSIS — R1319 Other dysphagia: ICD-10-CM

## 2017-10-14 DIAGNOSIS — O039 Complete or unspecified spontaneous abortion without complication: ICD-10-CM

## 2017-10-14 DIAGNOSIS — G479 Sleep disorder, unspecified: ICD-10-CM

## 2017-10-14 DIAGNOSIS — R768 Other specified abnormal immunological findings in serum: ICD-10-CM

## 2017-10-14 DIAGNOSIS — D7589 Other specified diseases of blood and blood-forming organs: Principal | ICD-10-CM

## 2017-10-14 DIAGNOSIS — G43909 Migraine, unspecified, not intractable, without status migrainosus: ICD-10-CM

## 2017-10-14 DIAGNOSIS — J45909 Unspecified asthma, uncomplicated: Principal | ICD-10-CM

## 2017-10-14 LAB — COMPREHENSIVE METABOLIC PANEL
Lab: 0.3 mg/dL (ref 0.3–1.2)
Lab: 0.9 mg/dL (ref 0.4–1.00)
Lab: 100 mg/dL (ref 70–100)
Lab: 102 MMOL/L (ref 98–110)
Lab: 118 U/L — ABNORMAL HIGH (ref 25–110)
Lab: 140 MMOL/L (ref 137–147)
Lab: 16 mg/dL (ref 7–25)
Lab: 3.9 MMOL/L (ref 3.5–5.1)
Lab: 4.1 g/dL (ref 3.5–5.0)
Lab: 7.3 g/dL (ref 6.0–8.0)
Lab: 9.5 mg/dL (ref 8.5–10.6)

## 2017-10-14 LAB — VITAMIN B12: Lab: 527 pg/mL — ABNORMAL HIGH (ref 180–914)

## 2017-10-14 LAB — URINALYSIS DIPSTICK
Lab: 1 % (ref 1.003–1.035)
Lab: 6 % — ABNORMAL LOW (ref 5.0–8.0)
Lab: NEGATIVE
Lab: NEGATIVE % (ref 0–2)
Lab: NEGATIVE % (ref 4–12)
Lab: NEGATIVE % — ABNORMAL HIGH (ref 0–5)
Lab: NEGATIVE 10*3/uL — ABNORMAL HIGH (ref 0–0.45)
Lab: NEGATIVE K/UL (ref 1.0–4.8)
Lab: NEGATIVE K/UL (ref 1.8–7.0)

## 2017-10-14 LAB — URINALYSIS, MICROSCOPIC

## 2017-10-14 LAB — CBC AND DIFF
Lab: 12 g/dL (ref 12.0–15.0)
Lab: 13 % (ref 11–15)
Lab: 3.9 M/UL — ABNORMAL LOW (ref 4.0–5.0)
Lab: 32 pg (ref >60)
Lab: 33 g/dL (ref 32.0–36.0)
Lab: 38 % (ref 36–45)
Lab: 8.1 10*3/uL (ref 4.5–11.0)
Lab: 96 FL (ref >60)

## 2017-10-14 LAB — PROTEIN/CR RATIO,UR RAN
Lab: 6 mg/dL
Lab: 88 mg/dL
Lab: 0.1

## 2017-10-14 LAB — C4 COMPLEMENT 4: Lab: 50 mg/dL — ABNORMAL HIGH (ref 10–49)

## 2017-10-14 LAB — C3 COMPLEMENT 3: Lab: 151 mg/dL (ref 88–200)

## 2017-10-17 LAB — PERIPHERAL SMEAR

## 2017-11-17 ENCOUNTER — Encounter: Admit: 2017-11-17 | Discharge: 2017-11-17

## 2017-11-17 ENCOUNTER — Ambulatory Visit: Admit: 2017-11-17 | Discharge: 2017-11-18 | Payer: Commercial Managed Care - PPO

## 2017-11-17 DIAGNOSIS — M1990 Unspecified osteoarthritis, unspecified site: ICD-10-CM

## 2017-11-17 DIAGNOSIS — G43909 Migraine, unspecified, not intractable, without status migrainosus: ICD-10-CM

## 2017-11-17 DIAGNOSIS — R079 Chest pain, unspecified: ICD-10-CM

## 2017-11-17 DIAGNOSIS — I499 Cardiac arrhythmia, unspecified: ICD-10-CM

## 2017-11-17 DIAGNOSIS — O039 Complete or unspecified spontaneous abortion without complication: ICD-10-CM

## 2017-11-17 DIAGNOSIS — J45909 Unspecified asthma, uncomplicated: Principal | ICD-10-CM

## 2017-11-17 DIAGNOSIS — R1319 Other dysphagia: ICD-10-CM

## 2017-11-17 DIAGNOSIS — K5900 Constipation, unspecified: ICD-10-CM

## 2017-11-17 DIAGNOSIS — R413 Other amnesia: ICD-10-CM

## 2017-11-17 DIAGNOSIS — G479 Sleep disorder, unspecified: ICD-10-CM

## 2017-11-17 MED ORDER — DOCUSATE SODIUM 100 MG PO CAP
100 mg | ORAL_CAPSULE | Freq: Two times a day (BID) | ORAL | 3 refills | Status: AC
Start: 2017-11-17 — End: 2019-08-09

## 2017-11-17 MED ORDER — PROPRANOLOL 10 MG PO TAB
10 mg | ORAL_TABLET | Freq: Three times a day (TID) | ORAL | 1 refills | Status: AC
Start: 2017-11-17 — End: 2018-05-11

## 2017-11-18 DIAGNOSIS — R768 Other specified abnormal immunological findings in serum: Principal | ICD-10-CM

## 2017-11-18 DIAGNOSIS — Z1239 Encounter for other screening for malignant neoplasm of breast: ICD-10-CM

## 2017-11-28 ENCOUNTER — Encounter: Admit: 2017-11-28 | Discharge: 2017-11-28

## 2017-12-19 ENCOUNTER — Encounter: Admit: 2017-12-19 | Discharge: 2017-12-19

## 2017-12-21 ENCOUNTER — Encounter: Admit: 2017-12-21 | Discharge: 2017-12-21

## 2017-12-28 ENCOUNTER — Encounter: Admit: 2017-12-28 | Discharge: 2017-12-28

## 2017-12-28 DIAGNOSIS — Z124 Encounter for screening for malignant neoplasm of cervix: Principal | ICD-10-CM

## 2017-12-30 ENCOUNTER — Ambulatory Visit: Admit: 2017-12-30 | Discharge: 2017-12-30

## 2017-12-30 ENCOUNTER — Encounter: Admit: 2017-12-30 | Discharge: 2017-12-30

## 2017-12-30 ENCOUNTER — Ambulatory Visit: Admit: 2017-12-30 | Discharge: 2017-12-30 | Payer: Commercial Managed Care - PPO

## 2017-12-30 DIAGNOSIS — Z1159 Encounter for screening for other viral diseases: ICD-10-CM

## 2017-12-30 DIAGNOSIS — Z1239 Encounter for other screening for malignant neoplasm of breast: Principal | ICD-10-CM

## 2017-12-30 DIAGNOSIS — G43009 Migraine without aura, not intractable, without status migrainosus: ICD-10-CM

## 2017-12-30 DIAGNOSIS — Z124 Encounter for screening for malignant neoplasm of cervix: ICD-10-CM

## 2017-12-30 DIAGNOSIS — J45909 Unspecified asthma, uncomplicated: Principal | ICD-10-CM

## 2017-12-30 DIAGNOSIS — G43909 Migraine, unspecified, not intractable, without status migrainosus: ICD-10-CM

## 2017-12-30 DIAGNOSIS — G479 Sleep disorder, unspecified: ICD-10-CM

## 2017-12-30 DIAGNOSIS — R1319 Other dysphagia: ICD-10-CM

## 2017-12-30 DIAGNOSIS — Z0000 Encounter for general adult medical examination without abnormal findings: ICD-10-CM

## 2017-12-30 DIAGNOSIS — M1990 Unspecified osteoarthritis, unspecified site: ICD-10-CM

## 2017-12-30 DIAGNOSIS — F332 Major depressive disorder, recurrent severe without psychotic features: ICD-10-CM

## 2017-12-30 DIAGNOSIS — R079 Chest pain, unspecified: ICD-10-CM

## 2017-12-30 DIAGNOSIS — R413 Other amnesia: ICD-10-CM

## 2017-12-30 DIAGNOSIS — O039 Complete or unspecified spontaneous abortion without complication: ICD-10-CM

## 2017-12-30 DIAGNOSIS — I499 Cardiac arrhythmia, unspecified: ICD-10-CM

## 2017-12-30 MED ORDER — AMITRIPTYLINE 100 MG PO TAB
150 mg | ORAL_TABLET | Freq: Every evening | ORAL | 1 refills | Status: AC
Start: 2017-12-30 — End: 2018-07-05

## 2018-01-02 ENCOUNTER — Encounter: Admit: 2018-01-02 | Discharge: 2018-01-02

## 2018-01-02 ENCOUNTER — Encounter: Admit: 2018-01-02 | Discharge: 2018-01-03

## 2018-01-02 DIAGNOSIS — Z124 Encounter for screening for malignant neoplasm of cervix: Principal | ICD-10-CM

## 2018-01-03 ENCOUNTER — Encounter: Admit: 2018-01-02 | Discharge: 2018-01-02 | Payer: Commercial Managed Care - PPO

## 2018-01-03 ENCOUNTER — Encounter: Admit: 2018-01-03 | Discharge: 2018-01-03

## 2018-01-03 DIAGNOSIS — Z124 Encounter for screening for malignant neoplasm of cervix: Principal | ICD-10-CM

## 2018-01-06 ENCOUNTER — Encounter: Admit: 2018-01-06 | Discharge: 2018-01-06

## 2018-01-10 ENCOUNTER — Encounter: Admit: 2018-01-10 | Discharge: 2018-01-10

## 2018-01-13 ENCOUNTER — Encounter: Admit: 2018-01-13 | Discharge: 2018-01-13

## 2018-01-15 ENCOUNTER — Encounter: Admit: 2018-01-15 | Discharge: 2018-01-15

## 2018-01-17 ENCOUNTER — Encounter: Admit: 2018-01-17 | Discharge: 2018-01-17

## 2018-01-17 ENCOUNTER — Ambulatory Visit: Admit: 2018-01-17 | Discharge: 2018-01-17

## 2018-01-17 ENCOUNTER — Ambulatory Visit: Admit: 2018-01-17 | Discharge: 2018-01-17 | Payer: Commercial Managed Care - PPO

## 2018-01-17 ENCOUNTER — Ambulatory Visit: Admit: 2018-01-17 | Discharge: 2018-01-18

## 2018-01-17 DIAGNOSIS — R928 Other abnormal and inconclusive findings on diagnostic imaging of breast: Principal | ICD-10-CM

## 2018-01-17 DIAGNOSIS — N6341 Unspecified lump in right breast, subareolar: ICD-10-CM

## 2018-01-17 DIAGNOSIS — N6310 Unspecified lump in the right breast, unspecified quadrant: ICD-10-CM

## 2018-01-17 MED ORDER — LIDOCAINE 1%-EPINEPHRINE 1:100000 (BUFFERED) VIAL (BREAST CENTER)
5 mL | Freq: Once | INTRAMUSCULAR | 0 refills | Status: CP
Start: 2018-01-17 — End: ?

## 2018-01-17 MED ORDER — LIDOCAINE 1% (BUFFERED) SYRINGE (BREAST CENTER)
.5 mL | Freq: Once | INTRAMUSCULAR | 0 refills | Status: CP
Start: 2018-01-17 — End: ?

## 2018-01-25 ENCOUNTER — Encounter: Admit: 2018-01-25 | Discharge: 2018-01-25

## 2018-01-25 MED ORDER — OMEPRAZOLE 40 MG PO CPDR
ORAL_CAPSULE | Freq: Two times a day (BID) | 11 refills | Status: AC
Start: 2018-01-25 — End: 2019-05-07

## 2018-02-08 ENCOUNTER — Encounter: Admit: 2018-02-08 | Discharge: 2018-02-08

## 2018-02-20 ENCOUNTER — Encounter: Admit: 2018-02-20 | Discharge: 2018-02-20

## 2018-03-20 ENCOUNTER — Ambulatory Visit: Admit: 2018-03-20 | Discharge: 2018-03-20 | Payer: Commercial Managed Care - PPO

## 2018-03-20 ENCOUNTER — Encounter: Admit: 2018-03-20 | Discharge: 2018-03-20

## 2018-03-20 ENCOUNTER — Ambulatory Visit: Admit: 2018-03-20 | Discharge: 2018-03-20

## 2018-03-20 DIAGNOSIS — D721 Eosinophilia: ICD-10-CM

## 2018-03-20 DIAGNOSIS — R1319 Other dysphagia: ICD-10-CM

## 2018-03-20 DIAGNOSIS — Z0000 Encounter for general adult medical examination without abnormal findings: Principal | ICD-10-CM

## 2018-03-20 DIAGNOSIS — Z1159 Encounter for screening for other viral diseases: ICD-10-CM

## 2018-03-20 DIAGNOSIS — E569 Vitamin deficiency, unspecified: Secondary | ICD-10-CM

## 2018-03-20 DIAGNOSIS — G43909 Migraine, unspecified, not intractable, without status migrainosus: ICD-10-CM

## 2018-03-20 DIAGNOSIS — K5900 Constipation, unspecified: ICD-10-CM

## 2018-03-20 DIAGNOSIS — I499 Cardiac arrhythmia, unspecified: ICD-10-CM

## 2018-03-20 DIAGNOSIS — O039 Complete or unspecified spontaneous abortion without complication: ICD-10-CM

## 2018-03-20 DIAGNOSIS — J45909 Unspecified asthma, uncomplicated: Principal | ICD-10-CM

## 2018-03-20 DIAGNOSIS — G479 Sleep disorder, unspecified: ICD-10-CM

## 2018-03-20 DIAGNOSIS — M1990 Unspecified osteoarthritis, unspecified site: ICD-10-CM

## 2018-03-20 DIAGNOSIS — R140 Abdominal distension (gaseous): ICD-10-CM

## 2018-03-20 DIAGNOSIS — R079 Chest pain, unspecified: ICD-10-CM

## 2018-03-20 DIAGNOSIS — R413 Other amnesia: ICD-10-CM

## 2018-03-20 DIAGNOSIS — K9289 Other specified diseases of the digestive system: ICD-10-CM

## 2018-03-20 LAB — CBC AND DIFF
Lab: 0 10*3/uL (ref 0–0.20)
Lab: 0.5 10*3/uL (ref 0–0.80)
Lab: 0.7 10*3/uL — ABNORMAL HIGH (ref 0–0.45)
Lab: 1 % (ref 0–2)
Lab: 13 % (ref >60)
Lab: 2.4 10*3/uL (ref 1.0–4.8)
Lab: 3.9 M/UL — ABNORMAL LOW (ref 4.0–5.0)
Lab: 31 % (ref 24–44)
Lab: 33 pg (ref 26–34)
Lab: 332 K/UL (ref >60)
Lab: 34 g/dL (ref 32.0–36.0)
Lab: 38 % (ref 36–45)
Lab: 4.2 10*3/uL (ref 1.8–7.0)
Lab: 53 % (ref 41–77)
Lab: 6 % (ref 4–12)
Lab: 7.5 FL (ref 7–11)
Lab: 7.9 10*3/uL (ref 4.5–11.0)
Lab: 9 % — ABNORMAL HIGH (ref 0–5)
Lab: 99 FL — ABNORMAL HIGH (ref 80–100)

## 2018-03-21 ENCOUNTER — Encounter: Admit: 2018-03-21 | Discharge: 2018-03-21

## 2018-03-21 DIAGNOSIS — E538 Deficiency of other specified B group vitamins: Principal | ICD-10-CM

## 2018-03-21 LAB — LIPID PROFILE
Lab: 115 mg/dL — ABNORMAL HIGH (ref <100)
Lab: 135 mg/dL (ref 6.0–8.0)
Lab: 199 mg/dL (ref <200)
Lab: 63 mg/dL (ref <150)

## 2018-03-21 LAB — TSH WITH FREE T4 REFLEX: Lab: 1.1 uU/mL (ref 0.35–5.00)

## 2018-03-21 LAB — VITAMIN B12: Lab: 496 pg/mL — ABNORMAL HIGH (ref 180–914)

## 2018-03-21 LAB — HEPATITIS C ANTIBODY W REFLEX HCV PCR QUANT: Lab: NEGATIVE mg/dL (ref 7–25)

## 2018-03-21 LAB — COMPREHENSIVE METABOLIC PANEL
Lab: 140 MMOL/L (ref 137–147)
Lab: 3.6 MMOL/L (ref 3.5–5.1)

## 2018-03-22 ENCOUNTER — Encounter: Admit: 2018-03-22 | Discharge: 2018-03-22

## 2018-03-22 DIAGNOSIS — R413 Other amnesia: ICD-10-CM

## 2018-03-22 DIAGNOSIS — R1319 Other dysphagia: ICD-10-CM

## 2018-03-22 DIAGNOSIS — G43909 Migraine, unspecified, not intractable, without status migrainosus: ICD-10-CM

## 2018-03-22 DIAGNOSIS — M1990 Unspecified osteoarthritis, unspecified site: ICD-10-CM

## 2018-03-22 DIAGNOSIS — J45909 Unspecified asthma, uncomplicated: Principal | ICD-10-CM

## 2018-03-22 DIAGNOSIS — I499 Cardiac arrhythmia, unspecified: ICD-10-CM

## 2018-03-22 DIAGNOSIS — O039 Complete or unspecified spontaneous abortion without complication: ICD-10-CM

## 2018-03-22 DIAGNOSIS — R079 Chest pain, unspecified: ICD-10-CM

## 2018-03-22 DIAGNOSIS — G479 Sleep disorder, unspecified: ICD-10-CM

## 2018-04-14 ENCOUNTER — Encounter: Admit: 2018-04-14 | Discharge: 2018-04-14

## 2018-04-14 ENCOUNTER — Ambulatory Visit: Admit: 2018-04-14 | Discharge: 2018-04-15 | Payer: Commercial Managed Care - PPO

## 2018-04-14 DIAGNOSIS — M1990 Unspecified osteoarthritis, unspecified site: ICD-10-CM

## 2018-04-14 DIAGNOSIS — R413 Other amnesia: ICD-10-CM

## 2018-04-14 DIAGNOSIS — J45909 Unspecified asthma, uncomplicated: Principal | ICD-10-CM

## 2018-04-14 DIAGNOSIS — G479 Sleep disorder, unspecified: ICD-10-CM

## 2018-04-14 DIAGNOSIS — R079 Chest pain, unspecified: ICD-10-CM

## 2018-04-14 DIAGNOSIS — I499 Cardiac arrhythmia, unspecified: ICD-10-CM

## 2018-04-14 DIAGNOSIS — R002 Palpitations: ICD-10-CM

## 2018-04-14 DIAGNOSIS — O039 Complete or unspecified spontaneous abortion without complication: ICD-10-CM

## 2018-04-14 DIAGNOSIS — G43909 Migraine, unspecified, not intractable, without status migrainosus: ICD-10-CM

## 2018-04-14 DIAGNOSIS — R1319 Other dysphagia: ICD-10-CM

## 2018-04-14 MED ORDER — ERENUMAB-AOOE 70 MG/ML SC ATIN
70 mg | SUBCUTANEOUS | 6 refills | 28.00000 days | Status: AC
Start: 2018-04-14 — End: 2018-05-04

## 2018-04-14 MED ORDER — RIZATRIPTAN 10 MG PO TAB
10 mg | ORAL_TABLET | Freq: Every day | ORAL | 6 refills | Status: AC | PRN
Start: 2018-04-14 — End: ?

## 2018-04-15 DIAGNOSIS — G43009 Migraine without aura, not intractable, without status migrainosus: Principal | ICD-10-CM

## 2018-04-17 ENCOUNTER — Encounter: Admit: 2018-04-17 | Discharge: 2018-04-17

## 2018-04-21 ENCOUNTER — Encounter: Admit: 2018-04-21 | Discharge: 2018-04-21

## 2018-05-01 ENCOUNTER — Encounter: Admit: 2018-05-01 | Discharge: 2018-05-01

## 2018-05-02 ENCOUNTER — Encounter: Admit: 2018-05-02 | Discharge: 2018-05-02

## 2018-05-02 MED ORDER — GALCANEZUMAB-GNLM 120 MG/ML SC PNIJ
120 mg | SUBCUTANEOUS | 11 refills | Status: AC
Start: 2018-05-02 — End: 2019-05-28

## 2018-05-02 MED ORDER — GALCANEZUMAB-GNLM 120 MG/ML SC PNIJ
240 mg | SUBCUTANEOUS | 0 refills | Status: AC
Start: 2018-05-02 — End: 2018-06-01
  Filled 2018-05-04 (×2): qty 2, 30d supply, fill #1

## 2018-05-03 ENCOUNTER — Encounter: Admit: 2018-05-03 | Discharge: 2018-05-03

## 2018-05-04 ENCOUNTER — Encounter: Admit: 2018-05-04 | Discharge: 2018-05-04

## 2018-05-11 ENCOUNTER — Encounter: Admit: 2018-05-11 | Discharge: 2018-05-11

## 2018-05-11 MED ORDER — PROPRANOLOL 10 MG PO TAB
10 mg | ORAL_TABLET | Freq: Three times a day (TID) | ORAL | 1 refills | Status: AC
Start: 2018-05-11 — End: 2018-11-14

## 2018-05-24 ENCOUNTER — Encounter: Admit: 2018-05-24 | Discharge: 2018-05-24

## 2018-05-29 ENCOUNTER — Encounter: Admit: 2018-05-29 | Discharge: 2018-05-29

## 2018-06-01 ENCOUNTER — Encounter: Admit: 2018-06-01 | Discharge: 2018-06-01

## 2018-06-01 NOTE — Telephone Encounter
Pharmacy Medication Reassessment: Calcitonin Gene-Related Peptide (CGRP) Receptor Antagonist    Appropriateness of Therapy   Emgality ([id number]-gnlm) is being used for the appropriate indication of migraine prevention.    The regimen of 120 mg subcutaneously monthly is planned to continue indefinitely which is appropriate for [name] No renal or hepatic adjustments are required for this medication.No additional dose titration is required.     Response to Therapy  Patient Assessments:   Subjective clinical assessment: on a scale of 1 to 10, the patient rates they are feeling 5 out of 10 while on treatment.   Subjective Quality of Life Measurement: Penny Townsend was unable to participate in social activities over the past 30 days.    Headache days per month: 30 unchanged, temporary improvements initially after injection    As the patient is achieving therapeutic benefit from the therapy the plan is to continue.     Adverse Effects  Penny Townsend is not experiencing any significant adverse effects to this medication regimen.    Adherence  Refill and adherence history were reviewed with the patient. The patient is adherent with refills and is meeting their refill goal. They report no missed doses over the past 30 days.  The patient is meeting their adherence goal. The patient was reminded about the refill process and re-educated on the importance of adherence.    Allergies   Allergies   Allergen Reactions   ??? Morphine DELUSIONS   ??? Oxycodone DELUSIONS        Vaccination Status Assessment   Immunization History   Administered Date(s) Administered   ??? Flu Vaccine =>65 YO High-Dose (PF) 04/10/2016   ??? Pneumococcal Vaccine (23-Val Adult) 04/10/2016     Vaccine history was reviewed with the patient. The patient was reminded about the importance of receiving an annual influenza vaccine as indicated.     Medication Reconciliation  A medication history and reconciliation were performed (including

## 2018-06-20 ENCOUNTER — Encounter: Admit: 2018-06-20 | Discharge: 2018-06-20

## 2018-06-26 ENCOUNTER — Encounter: Admit: 2018-06-26 | Discharge: 2018-06-26

## 2018-06-27 ENCOUNTER — Encounter: Admit: 2018-06-27 | Discharge: 2018-06-27

## 2018-06-29 ENCOUNTER — Encounter: Admit: 2018-06-29 | Discharge: 2018-06-29

## 2018-07-04 ENCOUNTER — Encounter: Admit: 2018-07-04 | Discharge: 2018-07-04

## 2018-07-05 ENCOUNTER — Encounter: Admit: 2018-07-05 | Discharge: 2018-07-05

## 2018-07-05 DIAGNOSIS — G43009 Migraine without aura, not intractable, without status migrainosus: Principal | ICD-10-CM

## 2018-07-05 MED ORDER — AMITRIPTYLINE 100 MG PO TAB
150 mg | ORAL_TABLET | Freq: Every evening | ORAL | 1 refills | Status: AC
Start: 2018-07-05 — End: 2019-01-02

## 2018-08-07 ENCOUNTER — Encounter: Admit: 2018-08-07 | Discharge: 2018-08-07

## 2018-08-07 MED FILL — GALCANEZUMAB-GNLM 120 MG/ML SC PNIJ: 120 mg/mL | SUBCUTANEOUS | 30 days supply | Qty: 1 | Fill #1 | Status: AC

## 2018-08-10 ENCOUNTER — Encounter: Admit: 2018-08-10 | Discharge: 2018-08-10

## 2018-08-15 ENCOUNTER — Encounter: Admit: 2018-08-15 | Discharge: 2018-08-15

## 2018-08-22 ENCOUNTER — Encounter: Admit: 2018-08-22 | Discharge: 2018-08-22

## 2018-08-23 MED ORDER — GABAPENTIN 600 MG PO TAB
600 mg | ORAL_TABLET | Freq: Three times a day (TID) | ORAL | 5 refills | Status: DC
Start: 2018-08-23 — End: 2019-08-21

## 2018-08-31 ENCOUNTER — Encounter: Admit: 2018-08-31 | Discharge: 2018-08-31

## 2018-08-31 MED FILL — GALCANEZUMAB-GNLM 120 MG/ML SC PNIJ: 120 mg/mL | SUBCUTANEOUS | 30 days supply | Qty: 1 | Fill #2 | Status: AC

## 2018-09-04 ENCOUNTER — Encounter: Admit: 2018-09-04 | Discharge: 2018-09-04

## 2018-09-08 ENCOUNTER — Ambulatory Visit: Admit: 2018-09-08 | Discharge: 2018-09-09 | Payer: Commercial Managed Care - PPO

## 2018-09-08 ENCOUNTER — Encounter: Admit: 2018-09-08 | Discharge: 2018-09-08

## 2018-09-08 DIAGNOSIS — G43009 Migraine without aura, not intractable, without status migrainosus: Principal | ICD-10-CM

## 2018-09-08 DIAGNOSIS — J45909 Unspecified asthma, uncomplicated: Principal | ICD-10-CM

## 2018-09-08 DIAGNOSIS — M1990 Unspecified osteoarthritis, unspecified site: ICD-10-CM

## 2018-09-08 DIAGNOSIS — G479 Sleep disorder, unspecified: ICD-10-CM

## 2018-09-08 DIAGNOSIS — R002 Palpitations: ICD-10-CM

## 2018-09-08 DIAGNOSIS — O039 Complete or unspecified spontaneous abortion without complication: ICD-10-CM

## 2018-09-08 DIAGNOSIS — G43909 Migraine, unspecified, not intractable, without status migrainosus: ICD-10-CM

## 2018-09-08 DIAGNOSIS — R1319 Other dysphagia: ICD-10-CM

## 2018-09-08 DIAGNOSIS — R079 Chest pain, unspecified: ICD-10-CM

## 2018-09-08 DIAGNOSIS — I499 Cardiac arrhythmia, unspecified: ICD-10-CM

## 2018-09-08 DIAGNOSIS — R413 Other amnesia: ICD-10-CM

## 2018-09-08 MED ORDER — DICLOFENAC SODIUM 50 MG PO TBEC
50 mg | ORAL_TABLET | ORAL | 6 refills | 60.00000 days | Status: DC | PRN
Start: 2018-09-08 — End: 2019-02-23

## 2018-09-08 NOTE — Progress Notes
liver   ??? Heart Attack Father    ??? Thyroid Disease Father    ??? Diabetes Father    ??? GI Cancer Father         liver CA   ??? Cancer Mother         pancreatic   ??? Thyroid Disease Mother    ??? Diabetes Mother    ??? GI Cancer Mother         pancreatic CA   ??? Colon Polyps Mother    ??? Other Sister         GI issues   ??? Other Brother         GI issues   ??? Melanoma Brother    ??? Other Brother         GI issues   ??? Celiac Disease Neg Hx    ??? Cancer-Colon Neg Hx    ??? Inflammatory Bowel Disease Neg Hx    ??? Rectal Cancer Neg Hx    ??? Ulcerative Colitis Neg Hx        ALLERGIES  Allergies   Allergen Reactions   ??? Morphine DELUSIONS   ??? Oxycodone DELUSIONS     Objective:         ??? acetaminophen SR (TYLENOL) 650 mg tablet Take 650 mg by mouth every 6 hours as needed for Pain.   ??? amitriptyline (ELAVIL) 100 mg tablet Take 1.5 tablets by mouth at bedtime daily.   ??? docusate (COLACE) 100 mg capsule Take one capsule by mouth twice daily.   ??? duloxetine DR (CYMBALTA) 60 mg capsule Take 60 mg by mouth daily.   ??? fexofenadine(+) (ALLEGRA) 180 mg tablet Take 180 mg by mouth daily.   ??? gabapentin (NEURONTIN) 600 mg tablet Take one tablet by mouth three times daily.   ??? galcanezumab-gnlm (EMGALITY) 120 mg/mL subcutaneous PEN Inject 1 mL under the skin every 30 days.   ??? multivitamin with minerals (MULTIVITAMIN & MINERAL FORMULA PO) Take 1 tablet by mouth daily.   ??? omeprazole DR(+) (PRILOSEC) 40 mg capsule TAKE 1 CAPSULE BY MOUTH TWICE DAILY 30 MINUTES TO 1 HOUR PRIOR TO FIRST AND LAST MEAL OF THE DAY   ??? other medication Take 1 Dose by mouth twice daily. Medication Name: Sterile Compounded Capsules     Ingredients per Capsule:   Methylcobalamin 1mg    Pyridoxal-5-Phosphate 12.5mg   5-methyltetrahydrofolate 1mg   Riboflavin-5-Phosphate 200mg   Co Q-10 150mg   Magnesium Glycinate 300mg   Vitamin D3 2500 IU   ??? propranolol (INDERAL) 10 mg tablet Take one tablet by mouth three times daily.

## 2018-09-10 ENCOUNTER — Encounter: Admit: 2018-09-10 | Discharge: 2018-09-10

## 2018-09-10 DIAGNOSIS — M1990 Unspecified osteoarthritis, unspecified site: ICD-10-CM

## 2018-09-10 DIAGNOSIS — R079 Chest pain, unspecified: ICD-10-CM

## 2018-09-10 DIAGNOSIS — R002 Palpitations: ICD-10-CM

## 2018-09-10 DIAGNOSIS — J45909 Unspecified asthma, uncomplicated: Principal | ICD-10-CM

## 2018-09-10 DIAGNOSIS — R1319 Other dysphagia: ICD-10-CM

## 2018-09-10 DIAGNOSIS — G43909 Migraine, unspecified, not intractable, without status migrainosus: ICD-10-CM

## 2018-09-10 DIAGNOSIS — R413 Other amnesia: ICD-10-CM

## 2018-09-10 DIAGNOSIS — I499 Cardiac arrhythmia, unspecified: ICD-10-CM

## 2018-09-10 DIAGNOSIS — G479 Sleep disorder, unspecified: ICD-10-CM

## 2018-09-10 DIAGNOSIS — O039 Complete or unspecified spontaneous abortion without complication: ICD-10-CM

## 2018-09-26 ENCOUNTER — Encounter: Admit: 2018-09-26 | Discharge: 2018-09-26

## 2018-09-27 MED ORDER — RIBOFLAVIN (VITAMIN B2) 400 MG PO TAB
1 | ORAL_TABLET | Freq: Every day | ORAL | 5 refills | Status: DC
Start: 2018-09-27 — End: 2019-05-16

## 2018-09-27 MED ORDER — MAGNESIUM OXIDE 400 MG MAGNESIUM PO TAB
1 | ORAL_TABLET | Freq: Every day | ORAL | 5 refills | Status: AC
Start: 2018-09-27 — End: ?

## 2018-09-28 ENCOUNTER — Encounter: Admit: 2018-09-28 | Discharge: 2018-09-28

## 2018-09-28 MED FILL — GALCANEZUMAB-GNLM 120 MG/ML SC PNIJ: 120 mg/mL | SUBCUTANEOUS | 30 days supply | Qty: 1 | Fill #3 | Status: AC

## 2018-10-25 ENCOUNTER — Encounter: Admit: 2018-10-25 | Discharge: 2018-10-25

## 2018-10-25 NOTE — Telephone Encounter
Pt LVM inquiring she has vomited on 6/13,6/30 and this morning and would like to be seen. Calling pt back to get more information. Could not get a hold of the pt, sent her a mychart message

## 2018-10-25 NOTE — Telephone Encounter
LVM for pt returning her call. Let her know sent her a mychart message and left call back number.

## 2018-10-30 ENCOUNTER — Encounter: Admit: 2018-10-30 | Discharge: 2018-10-30

## 2018-10-30 MED FILL — GALCANEZUMAB-GNLM 120 MG/ML SC PNIJ: 120 mg/mL | SUBCUTANEOUS | 30 days supply | Qty: 1 | Fill #4 | Status: AC

## 2018-10-31 ENCOUNTER — Encounter: Admit: 2018-10-31 | Discharge: 2018-10-31

## 2018-10-31 NOTE — Telephone Encounter
Called pt with Dr. Serita Grit update below, pt verblaized she is doing better and will work on doing the [employer] for a few days to see if that helps and will schedule an apt with Dr. Estevan Oaks on the portal. Pt had no new questions at this time.

## 2018-11-09 ENCOUNTER — Encounter: Admit: 2018-11-09 | Discharge: 2018-11-09

## 2018-11-10 ENCOUNTER — Encounter: Admit: 2018-11-10 | Discharge: 2018-11-10

## 2018-11-13 ENCOUNTER — Encounter: Admit: 2018-11-13 | Discharge: 2018-11-13

## 2018-11-14 MED ORDER — PROPRANOLOL 10 MG PO TAB
ORAL_TABLET | Freq: Three times a day (TID) | 1 refills | Status: DC
Start: 2018-11-14 — End: 2019-01-25

## 2018-11-29 ENCOUNTER — Encounter: Admit: 2018-11-29 | Discharge: 2018-11-29

## 2018-11-29 MED FILL — GALCANEZUMAB-GNLM 120 MG/ML SC PNIJ: 120 mg/mL | SUBCUTANEOUS | 30 days supply | Qty: 1 | Fill #5 | Status: AC

## 2018-12-12 ENCOUNTER — Encounter: Admit: 2018-12-12 | Discharge: 2018-12-12

## 2018-12-22 ENCOUNTER — Encounter: Admit: 2018-12-22 | Discharge: 2018-12-22

## 2018-12-22 DIAGNOSIS — Z1231 Encounter for screening mammogram for malignant neoplasm of breast: Secondary | ICD-10-CM

## 2018-12-27 ENCOUNTER — Encounter: Admit: 2018-12-27 | Discharge: 2018-12-27

## 2018-12-30 ENCOUNTER — Encounter: Admit: 2018-12-30 | Discharge: 2018-12-30

## 2018-12-30 DIAGNOSIS — G43009 Migraine without aura, not intractable, without status migrainosus: Secondary | ICD-10-CM

## 2019-01-02 MED ORDER — AMITRIPTYLINE 100 MG PO TAB
150 mg | ORAL_TABLET | Freq: Every evening | ORAL | 1 refills | Status: DC
Start: 2019-01-02 — End: 2019-01-25

## 2019-01-02 MED ORDER — AMITRIPTYLINE 100 MG PO TAB
ORAL_TABLET | Freq: Every evening | 0 refills | Status: DC
Start: 2019-01-02 — End: 2019-01-02

## 2019-01-02 NOTE — Telephone Encounter
Last seen 12/30/2017 per Dr. Candiss Norse (Physical exam)  No upcoming appointment

## 2019-01-03 ENCOUNTER — Encounter: Admit: 2019-01-03 | Discharge: 2019-01-03

## 2019-01-03 MED FILL — GALCANEZUMAB-GNLM 120 MG/ML SC PNIJ: 120 mg/mL | SUBCUTANEOUS | 30 days supply | Qty: 1 | Fill #6 | Status: AC

## 2019-01-09 ENCOUNTER — Encounter: Admit: 2019-01-09 | Discharge: 2019-01-09

## 2019-01-17 ENCOUNTER — Encounter: Admit: 2019-01-17 | Discharge: 2019-01-17

## 2019-01-17 ENCOUNTER — Ambulatory Visit: Admit: 2019-01-17 | Discharge: 2019-01-17 | Payer: Commercial Managed Care - PPO

## 2019-01-24 ENCOUNTER — Encounter: Admit: 2019-01-24 | Discharge: 2019-01-24

## 2019-01-24 NOTE — Progress Notes
The Prior Authorization for [facility] was submitted for [place] via [email or web address].  Will continue to follow.    [place] Patient Advocate  [phone]

## 2019-01-25 ENCOUNTER — Encounter: Admit: 2019-01-25 | Discharge: 2019-01-25

## 2019-01-25 DIAGNOSIS — G43009 Migraine without aura, not intractable, without status migrainosus: Secondary | ICD-10-CM

## 2019-01-25 MED ORDER — PROPRANOLOL 10 MG PO TAB
10 mg | ORAL_TABLET | Freq: Three times a day (TID) | ORAL | 0 refills | Status: DC
Start: 2019-01-25 — End: 2019-05-02

## 2019-01-25 MED ORDER — AMITRIPTYLINE 100 MG PO TAB
150 mg | ORAL_TABLET | Freq: Every evening | ORAL | 0 refills | Status: DC
Start: 2019-01-25 — End: 2019-04-23

## 2019-01-26 ENCOUNTER — Encounter: Admit: 2019-01-26 | Discharge: 2019-01-26

## 2019-01-26 MED FILL — GALCANEZUMAB-GNLM 120 MG/ML SC PNIJ: 120 mg/mL | SUBCUTANEOUS | 30 days supply | Qty: 1 | Fill #7 | Status: AC

## 2019-01-26 NOTE — Progress Notes
The Prior Authorization for Emgality was approved for Penny Townsend from 01/26/2019 to 07/27/2019.  The copay is $50.  The PA authorization number is [id number].    Copay assistance was previously obtained for the patient using copay card from manufacturer and now the copay is $0.  Penny Townsend has stated this copay is affordable.  The specialty pharmacy will pursue additional copay assistance as necessary.  The specialty pharmacy will reach out to the ambulatory clinical pharmacist and provider's nurse if the copay becomes unaffordable.    The medication will be delivered to patient's prescription address per the patient's request.    [place] Patient Advocate  [phone]

## 2019-01-29 NOTE — Progress Notes
This encounter was created in error. Please disregard.

## 2019-01-30 ENCOUNTER — Encounter: Admit: 2019-01-30 | Discharge: 2019-01-30

## 2019-02-20 ENCOUNTER — Encounter: Admit: 2019-02-20 | Discharge: 2019-02-20

## 2019-02-20 MED FILL — GALCANEZUMAB-GNLM 120 MG/ML SC PNIJ: 120 mg/mL | SUBCUTANEOUS | 30 days supply | Qty: 1 | Fill #8 | Status: AC

## 2019-02-23 ENCOUNTER — Encounter: Admit: 2019-02-23 | Discharge: 2019-02-23

## 2019-02-23 ENCOUNTER — Ambulatory Visit: Admit: 2019-02-23 | Discharge: 2019-02-24 | Payer: Commercial Managed Care - PPO

## 2019-02-23 DIAGNOSIS — R413 Other amnesia: Secondary | ICD-10-CM

## 2019-02-23 DIAGNOSIS — R002 Palpitations: Secondary | ICD-10-CM

## 2019-02-23 DIAGNOSIS — R1319 Other dysphagia: Secondary | ICD-10-CM

## 2019-02-23 DIAGNOSIS — M1990 Unspecified osteoarthritis, unspecified site: Secondary | ICD-10-CM

## 2019-02-23 DIAGNOSIS — G479 Sleep disorder, unspecified: Secondary | ICD-10-CM

## 2019-02-23 DIAGNOSIS — G43719 Chronic migraine without aura, intractable, without status migrainosus: Secondary | ICD-10-CM

## 2019-02-23 DIAGNOSIS — I499 Cardiac arrhythmia, unspecified: Secondary | ICD-10-CM

## 2019-02-23 DIAGNOSIS — G43909 Migraine, unspecified, not intractable, without status migrainosus: Secondary | ICD-10-CM

## 2019-02-23 DIAGNOSIS — J45909 Unspecified asthma, uncomplicated: Secondary | ICD-10-CM

## 2019-02-23 DIAGNOSIS — R079 Chest pain, unspecified: Secondary | ICD-10-CM

## 2019-02-23 DIAGNOSIS — O039 Complete or unspecified spontaneous abortion without complication: Secondary | ICD-10-CM

## 2019-03-27 ENCOUNTER — Encounter: Admit: 2019-03-27 | Discharge: 2019-03-27

## 2019-03-27 MED FILL — GALCANEZUMAB-GNLM 120 MG/ML SC PNIJ: 120 mg/mL | SUBCUTANEOUS | 30 days supply | Qty: 1 | Fill #9 | Status: AC

## 2019-04-23 ENCOUNTER — Encounter: Admit: 2019-04-23 | Discharge: 2019-04-23

## 2019-04-23 DIAGNOSIS — G43009 Migraine without aura, not intractable, without status migrainosus: Secondary | ICD-10-CM

## 2019-04-23 MED ORDER — AMITRIPTYLINE 100 MG PO TAB
150 mg | ORAL_TABLET | Freq: Every evening | ORAL | 0 refills | Status: DC
Start: 2019-04-23 — End: 2019-05-24

## 2019-04-23 NOTE — Telephone Encounter
Physical: 12/30/2017

## 2019-04-24 ENCOUNTER — Encounter: Admit: 2019-04-24 | Discharge: 2019-04-24

## 2019-04-25 ENCOUNTER — Encounter: Admit: 2019-04-25 | Discharge: 2019-04-25

## 2019-04-27 MED FILL — GALCANEZUMAB-GNLM 120 MG/ML SC PNIJ: 120 mg/mL | SUBCUTANEOUS | 30 days supply | Qty: 1 | Fill #10 | Status: AC

## 2019-05-01 ENCOUNTER — Encounter: Admit: 2019-05-01 | Discharge: 2019-05-01

## 2019-05-02 MED ORDER — PROPRANOLOL 10 MG PO TAB
ORAL_TABLET | Freq: Three times a day (TID) | 0 refills | Status: DC
Start: 2019-05-02 — End: 2019-05-15

## 2019-05-05 ENCOUNTER — Encounter: Admit: 2019-05-05 | Discharge: 2019-05-05

## 2019-05-07 MED ORDER — OMEPRAZOLE 40 MG PO CPDR
ORAL_CAPSULE | Freq: Two times a day (BID) | ORAL | 3 refills | 90.00000 days | Status: AC
Start: 2019-05-07 — End: ?

## 2019-05-12 ENCOUNTER — Encounter: Admit: 2019-05-12 | Discharge: 2019-05-12

## 2019-05-15 MED ORDER — PROPRANOLOL 10 MG PO TAB
ORAL_TABLET | Freq: Three times a day (TID) | ORAL | 0 refills | 30.00000 days | Status: DC
Start: 2019-05-15 — End: 2019-09-03

## 2019-05-16 MED ORDER — RIBOFLAVIN (VITAMIN B2) 400 MG PO TAB
1 | ORAL_TABLET | Freq: Every day | ORAL | 5 refills | Status: AC
Start: 2019-05-16 — End: ?

## 2019-05-20 ENCOUNTER — Encounter: Admit: 2019-05-20 | Discharge: 2019-05-20

## 2019-05-20 DIAGNOSIS — G43009 Migraine without aura, not intractable, without status migrainosus: Secondary | ICD-10-CM

## 2019-05-24 ENCOUNTER — Encounter: Admit: 2019-05-24 | Discharge: 2019-05-24

## 2019-05-24 MED ORDER — AMITRIPTYLINE 100 MG PO TAB
ORAL_TABLET | Freq: Every evening | ORAL | 0 refills | 30.00000 days | Status: AC
Start: 2019-05-24 — End: ?

## 2019-05-28 ENCOUNTER — Encounter: Admit: 2019-05-28 | Discharge: 2019-05-28

## 2019-05-28 MED ORDER — GALCANEZUMAB-GNLM 120 MG/ML SC PNIJ
120 mg | SUBCUTANEOUS | 5 refills | 30.00000 days | Status: DC
Start: 2019-05-28 — End: 2019-11-09
  Filled 2019-05-28: qty 1, 30d supply, fill #1

## 2019-05-31 ENCOUNTER — Encounter: Admit: 2019-05-31 | Discharge: 2019-05-31

## 2019-05-31 NOTE — Telephone Encounter
Pharmacy Medication Reassessment: Calcitonin Gene-Related Peptide (CGRP) Receptor Antagonist    Appropriateness of Therapy   Emgality (Galcanezumab-gnlm) is being used for the appropriate indication of migraine prevention.    The regimen of 120 mg subcutaneously monthly is planned to continue indefinitely which is appropriate for [name] No renal or hepatic adjustments are required for this medication.No additional dose titration is required.     Therapeutic Goals and Response to Therapy  Patient Assessments:    Headache days per month: was having constant headaches before Emgality, now she explains having complete headache free days.  About 2-3 migraines per month, which is significant improvement  Headache severity: on average headaches are about 5-7 but 10 out of 10 when she experiences a migraine.    As the patient is achieving therapeutic benefit from the therapy the plan is to continue.     Adverse Effects  Penny Townsend is not experiencing any significant adverse effects to this medication regimen.    Adherence  Refill and adherence history were reviewed with the patient. The patient is adherent with refills and is meeting their refill goal. They report no missed doses over the past 30 days.  The patient is meeting their adherence goal. The patient was reminded about the refill process and re-educated on the importance of adherence.    Allergies   Allergies   Allergen Reactions   ? Morphine DELUSIONS   ? Other [Unclassified Drug] SWOLLEN TONGUE     Ground beef, and ground pork.   ? Oxycodone DELUSIONS   ? Gluten STOMACH UPSET   ? Mango SEE COMMENTS     Swollen lip        Vaccination Status Assessment   Immunization History   Administered Date(s) Administered   ? Flu Vaccine =>65 YO High-Dose (PF) 04/10/2016   ? Pneumococcal Vaccine (23-Val Adult) 04/10/2016 Vaccine history was reviewed with the patient. The patient was reminded about the importance of receiving an annual influenza vaccine as indicated.     Medication Reconciliation  A medication history and reconciliation were performed (including prescription medications, supplements, over the counter, and herbal products). The medication list was updated and the patients? current medication list is included below.     Home Medications    Medication Sig   acetaminophen SR (TYLENOL) 650 mg tablet Take 650 mg by mouth every 6 hours as needed for Pain.   amitriptyline (ELAVIL) 100 mg tablet TAKE 1&1/2 TABS BY MOUTH EVERY NIGHT AT BEDTIME   docusate (COLACE) 100 mg capsule Take one capsule by mouth twice daily.   ergocalciferol (vitamin D2) (VITAMIN D PO) Take  by mouth daily.   fexofenadine(+) (ALLEGRA) 180 mg tablet Take 180 mg by mouth daily.   gabapentin (NEURONTIN) 600 mg tablet Take one tablet by mouth three times daily.   galcanezumab-gnlm (EMGALITY) 120 mg/mL subcutaneous PEN Inject 1 mL under the skin every 30 days.   ginger root (GINGER EXTRACT PO) Take  by mouth daily.   magnesium oxide 400 mg magnesium tab Take 1 tablet by mouth daily.   multivitamin with minerals (MULTIVITAMIN & MINERAL FORMULA PO) Take 1 tablet by mouth daily.   omeprazole DR (PRILOSEC) 40 mg capsule TAKE 1 CAPSULE BY MOUTH TWICE DAILY 30 MINUTES TO 1 HOUR PRIOR TO FIRST AND LAST MEAL OF THE DAY   other medication 1 Dose once. Medication Name & Strength: Tumeric    Dose(how many): tablet    Frequency(how often): Daily   other medication Take 1  Dose by mouth twice daily. Medication Name: Sterile Compounded Capsules     Ingredients per Capsule:   Methylcobalamin 1mg    Pyridoxal-5-Phosphate 12.5mg   5-methyltetrahydrofolate 1mg   Riboflavin-5-Phosphate 200mg   Co Q-10 150mg   Magnesium Glycinate 300mg   Vitamin D3 2500 IU   propranoloL (INDERAL) 10 mg tablet TAKE 1 TABLET BY MOUTH THREE TIMES A DAY riboflavin (vitamin B2) 400 mg tab Take one tablet by mouth daily.   rizatriptan (MAXALT) 10 mg tablet Take one tablet by mouth daily as needed. May repeat in 2 hours in needed        Drug-drug and drug-food interactions between the patients? specialty medication and their medication list were assessed and reviewed with the patient. The patient was instructed to speak with their health care provider before starting any new drugs, including prescription or over the counter, natural / herbal products, or vitamins.    No new significant drug-drug or drug-food interactions were identified.    Their regimen can be taken with or without food.    Pregnancy Status  Pregnancy status was assessed and determined to be: Female, not of child-bearing potential: education not applicable.     Risk Evaluation and Mitigation Strategy (REMS) Assessment  No REMS program is required for this medication.     Follow-up Plan   Penny Townsend was given the opportunity to ask questions and did not have any questions at this time. The patient was encouraged to call with questions. The patient will be contacted to complete another reassessment within 1 year.     Ladona Mow, PHARMD

## 2019-06-04 ENCOUNTER — Encounter: Admit: 2019-06-04 | Discharge: 2019-06-04

## 2019-06-06 ENCOUNTER — Encounter: Admit: 2019-06-06 | Discharge: 2019-06-06

## 2019-06-20 ENCOUNTER — Encounter: Admit: 2019-06-20 | Discharge: 2019-06-20

## 2019-06-21 ENCOUNTER — Encounter: Admit: 2019-06-21 | Discharge: 2019-06-21

## 2019-06-21 MED FILL — GALCANEZUMAB-GNLM 120 MG/ML SC PNIJ: 120 mg/mL | SUBCUTANEOUS | 30 days supply | Qty: 1 | Fill #2 | Status: AC

## 2019-06-27 ENCOUNTER — Encounter: Admit: 2019-06-27 | Discharge: 2019-06-27

## 2019-07-16 ENCOUNTER — Encounter: Admit: 2019-07-16 | Discharge: 2019-07-16

## 2019-07-16 MED FILL — GALCANEZUMAB-GNLM 120 MG/ML SC PNIJ: 120 mg/mL | SUBCUTANEOUS | 30 days supply | Qty: 1 | Fill #3 | Status: AC

## 2019-07-20 ENCOUNTER — Encounter: Admit: 2019-07-20 | Discharge: 2019-07-20

## 2019-07-20 DIAGNOSIS — G43009 Migraine without aura, not intractable, without status migrainosus: Secondary | ICD-10-CM

## 2019-07-23 MED ORDER — AMITRIPTYLINE 100 MG PO TAB
ORAL_TABLET | Freq: Every evening | 0 refills
Start: 2019-07-23 — End: ?

## 2019-07-30 ENCOUNTER — Encounter: Admit: 2019-07-30 | Discharge: 2019-07-30

## 2019-07-30 NOTE — Telephone Encounter
Patient called and left a message that she fell into a metal fence on 07/28/19 and requests to be seen.     Attempted to contact patient. Left message on voicemail requesting a return call.   (Message stated "[employer]")

## 2019-08-07 ENCOUNTER — Encounter: Admit: 2019-08-07 | Discharge: 2019-08-07

## 2019-08-07 NOTE — Telephone Encounter
WC Adj Baldo Ash authorized evaluation & treatment of 07/28/19 head injury.    [facility] Claim # [id number]    WC Adj Baldo Ash  [phone] P  [phone] [email or web address]    Work Water quality scientist  [employer]  [address]  [place], [place] [address]

## 2019-08-09 ENCOUNTER — Encounter: Admit: 2019-08-09 | Discharge: 2019-08-09

## 2019-08-09 ENCOUNTER — Ambulatory Visit: Admit: 2019-08-09 | Discharge: 2019-08-10 | Payer: Worker's Compensation

## 2019-08-09 DIAGNOSIS — R413 Other amnesia: Secondary | ICD-10-CM

## 2019-08-09 DIAGNOSIS — R002 Palpitations: Secondary | ICD-10-CM

## 2019-08-09 DIAGNOSIS — I499 Cardiac arrhythmia, unspecified: Secondary | ICD-10-CM

## 2019-08-09 DIAGNOSIS — S060X0A Concussion without loss of consciousness, initial encounter: Secondary | ICD-10-CM

## 2019-08-09 DIAGNOSIS — G479 Sleep disorder, unspecified: Secondary | ICD-10-CM

## 2019-08-09 DIAGNOSIS — M542 Cervicalgia: Secondary | ICD-10-CM

## 2019-08-09 DIAGNOSIS — J45909 Unspecified asthma, uncomplicated: Secondary | ICD-10-CM

## 2019-08-09 DIAGNOSIS — M1990 Unspecified osteoarthritis, unspecified site: Secondary | ICD-10-CM

## 2019-08-09 DIAGNOSIS — O039 Complete or unspecified spontaneous abortion without complication: Secondary | ICD-10-CM

## 2019-08-09 DIAGNOSIS — G43909 Migraine, unspecified, not intractable, without status migrainosus: Secondary | ICD-10-CM

## 2019-08-09 DIAGNOSIS — R079 Chest pain, unspecified: Secondary | ICD-10-CM

## 2019-08-09 DIAGNOSIS — R1319 Other dysphagia: Secondary | ICD-10-CM

## 2019-08-09 NOTE — Progress Notes
Date of Service: 08/09/2019    Subjective:             Penny Townsend is a 54 y.o. female.    History of Present Illness  Penny Townsend presents for evaluation of concussion.  She reports that she was sweeping a porch at work, she lost her footing and fell hitting the back, left of her head on a pole of the fence.  She does not know if she lost consciousness and does not remember the actual impact. Since she has been having headache, neck/shoulder tightness, light & sound sensitivity, dizziness, nausea, slowed memory, eyes buzzing, and head floating.  She does feel like she is improving.    She has a significant history of chronic migraines/headaches on multiple medications.  She reports the headaches she is experiencing now are different.  Currently, the headaches are more on the sides and brain rattling.  They were constant but now come and go multiple times per day.  They are exacerbated by moving and light and improve with ice and being still.    The dizziness is improved.  Although she notes the car ride her really brought this back.  She notes that looking at moving objects or using the computer will worsen it.  She describes it like a head rush and can't stop the movement.  There is a spinning component as well as dysequilibrium.    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  She has not yet been back to work.       Review of Systems   Constitutional: Positive for fatigue.   HENT: Positive for congestion, ear pain, postnasal drip, rhinorrhea, sinus pressure, sinus pain, sneezing, trouble swallowing and voice change.    Eyes: Positive for photophobia, pain and visual disturbance.   Respiratory: Positive for chest tightness and wheezing.    Cardiovascular: Positive for chest pain.   Gastrointestinal: Positive for constipation.   Endocrine: Positive for cold intolerance.   Musculoskeletal: Positive for neck pain.   Allergic/Immunologic: Positive for environmental allergies and food allergies. Neurological: Positive for dizziness, light-headedness and headaches.   All other systems reviewed and are negative.        Objective:         ? acetaminophen SR (TYLENOL) 650 mg tablet Take 650 mg by mouth every 6 hours as needed for Pain.   ? amitriptyline (ELAVIL) 100 mg tablet TAKE 1&1/2 TABS BY MOUTH EVERY NIGHT AT BEDTIME   ? ergocalciferol (vitamin D2) (VITAMIN D PO) Take  by mouth daily.   ? fexofenadine(+) (ALLEGRA) 180 mg tablet Take 180 mg by mouth daily.   ? gabapentin (NEURONTIN) 600 mg tablet Take one tablet by mouth three times daily.   ? galcanezumab-gnlm (EMGALITY) 120 mg/mL subcutaneous PEN Inject 1 mL under the skin every 30 days.   ? ginger root (GINGER EXTRACT PO) Take  by mouth daily.   ? magnesium oxide 400 mg magnesium tab Take 1 tablet by mouth daily.   ? omeprazole DR (PRILOSEC) 40 mg capsule TAKE 1 CAPSULE BY MOUTH TWICE DAILY 30 MINUTES TO 1 HOUR PRIOR TO FIRST AND LAST MEAL OF THE DAY   ? other medication 1 Dose once. Medication Name & Strength: Tumeric    Dose(how many): tablet    Frequency(how often): Daily   ? propranoloL (INDERAL) 10 mg tablet TAKE 1 TABLET BY MOUTH THREE TIMES A DAY   ? riboflavin (vitamin B2) 400 mg tab Take one tablet by mouth daily.   ?  rizatriptan (MAXALT) 10 mg tablet Take one tablet by mouth daily as needed. May repeat in 2 hours in needed     Vitals:    08/09/19 0936   BP: 106/72   BP Source: Arm, Left Upper   Patient Position: Sitting   Pulse: 98   Temp: 36.3 ?C (97.4 ?F)   TempSrc: Skin   Weight: 67.6 kg (149 lb 1.6 oz)   Height: 165.1 cm (65)   PainSc: Six     Body mass index is 24.81 kg/m?Marland Kitchen     Physical Exam  Vitals signs reviewed.   Constitutional:       Appearance: Normal appearance.   HENT:      Head: Normocephalic and atraumatic.   Cardiovascular:      Rate and Rhythm: Normal rate and regular rhythm.   Pulmonary:      Breath sounds: Normal breath sounds.   Musculoskeletal:         General: No swelling.   Neurological:      Mental Status: She is alert.           Neuro Exam:  Mental Status: A&Ox3  Speech: fluent without dysarthria  CN: visual fields full to confrontation, PERRLA, EOMI, no facial numbness, strength of facial muscles is full and symmetric, hearing intact to conversation, symmetric palate elevation, tongue midline  Motor: normal tone/bulk, strength 5/5 throughout  Sensory: grossly intact to light touch/pin prick throughout  DTRs: 2/4 and symmetric bilaterally, toes down  Coordination: normal finger to nose bilaterally  Gait: normal    Concussion Vestibulo-occular exam:  H-test: Increased symptoms. Positive saccades, headache  Horizontal saccades: Increased symptoms. Hypometric, dizziness  Vertical saccades: Increased symptoms. Felt hard, choppy  Vestibulo-occular reflex horizontal: Increased symptoms. Dizziness  Vestibulo-occular reflex vertical: Increased symptoms. Neck pain  Visual Motor Sensitivity: Increased symptoms. Dizziness  Convergence: 12 cm    Concussion Symptom Checklist Score:  Concussion Symptom Checklist 08/09/2019   Headache 4   Balance Problem 0   Dizziness 2   Fatigue 4   Trouble Falling Asleep 3   Sleeping more than usual 0   Sleeping less than usual 0   Drowsiness 3   Sensitivity To Light 5   Sensitivity To Noise 4   Irritability 3   Sadness 3   Nervousness 2   More Emotional 1   Numbness or tingling and/or neck pain 0   Feeling Slowed Down 3   Feeling Mentally Foggy 3   Difficulty Concentrating 3   Difficult Remembering 3   Visual Problems 1   Neck Pain 5   Total Symptoms Score 55     ImPACT Composite Scores:  IMPACT Clinical Report       08/09/2019  1000             Exam Type:  Post-Injury 1     tested by [facility]    Memory Composite (Verbal):  77 (26%)    Memory Composite (Visual):  71 (69%)    Visual Motor Speed Composite:  25.55 (11%)    Reaction Time Composite:  0.94 (14%)    Impulse Control Composite:  4    Total Symptom Score:  91          Assessment and Plan:  1. Concussion without loss of consciousness, initial encounter     2. Cervicalgia       Penny Townsend suffered a concussion as a result of the work place fall on 07/28/19.  She is now 12 days from  the injury and reports some improvement, but still has significant vestibular-ocular and cognitive symptoms.  She does have a h/o of chronic migraines.  ImPACT scores reviewed--show mildly low memory composites, more significantly low speed composites.  Symptom score was 91 and has already improved to 55.    Plan:  1. Continued observation.  We discussed that 60% of concussions resolve in 7-14 days and that 80% resolve in 4-6 weeks.  2. We discussed limited use of screens and frequent breaks.  3. We discussed some low level exertion--such as walking or very low level on a stationary bike.  No more than 10-15 minutes.  4. If sleep becomes an issue take melatonin 3-6mg  qhs prn.  May use OTC analgesics prn for headaches.    Work status: will allow to return to work, max of 2 hrs/day, from home only    RTC in 2 weeks                    I spent 60 minutes with the patient.  The majority of that time was spent in counseling and coordination of care.  Specifically we discussed diagnosis, diagnostic procedures, therapeutics, medications & medication management, and prognosis

## 2019-08-13 NOTE — Progress Notes
The Prior Authorization for emgality was submitted for Penny Townsend via Cover My Meds.  Will continue to follow.    [facility]  Pharmacy Patient Advocate  [phone]

## 2019-08-14 MED FILL — GALCANEZUMAB-GNLM 120 MG/ML SC PNIJ: 120 mg/mL | SUBCUTANEOUS | 30 days supply | Qty: 1 | Fill #4 | Status: AC

## 2019-08-14 NOTE — Progress Notes
The renewal  Prior Authorization for [employer] was approved for Penny Townsend from 4.19.2021 to 4.20.2022.  The copay is $0.00. The co pay card was already on file. The PA authorization number is [id number] .      The specialty pharmacy will reach out to the provider's clinic contact if the copay becomes unaffordable.    The medication will be delivered to patient's prescription address per the patient's request.    Gaston Islam  Pharmacy Patient Advocate  [phone]

## 2019-08-21 MED ORDER — GABAPENTIN 800 MG PO TAB
800 mg | ORAL_TABLET | ORAL | 5 refills | Status: AC
Start: 2019-08-21 — End: ?

## 2019-08-21 MED ORDER — UBROGEPANT 50 MG PO TAB
50 mg | ORAL_TABLET | Freq: Every day | ORAL | 2 refills | 30.00000 days | Status: AC | PRN
Start: 2019-08-21 — End: ?

## 2019-08-27 ENCOUNTER — Encounter: Admit: 2019-08-27 | Discharge: 2019-08-27

## 2019-08-27 NOTE — Research Notes
Research Study Visit Note    NAME: Penny Townsend             MRN: [id number]             DOB:1965-08-15         [facility] Number: [id number]  Study: Ghrelin (OXE-103) for acute concussion management  Intervention: OXE-103 subcutaneous injections twice daily for two weeks.      Spoke with study participant on phone for Day 4 phone check-in visit.  Confirmed no missed doses, no worsening or new symptoms, no concerns.  Reminded her to complete questionnaires and neurocognitive testing today.    Next due: Day 8 clinic visit, including new drug supply, questionnaires, neurocognitive testing, labs.

## 2019-08-30 ENCOUNTER — Ambulatory Visit: Admit: 2019-08-30 | Discharge: 2019-08-30 | Payer: Worker's Compensation

## 2019-08-30 ENCOUNTER — Encounter: Admit: 2019-08-30 | Discharge: 2019-08-30

## 2019-08-30 LAB — CBC
Lab: 10 10*3/uL (ref 4.5–11.0)
Lab: 100 FL — ABNORMAL HIGH (ref 80–100)
Lab: 12 % (ref 11–15)
Lab: 13 g/dL (ref 12.0–15.0)
Lab: 3.8 M/UL — ABNORMAL LOW (ref 4.0–5.0)
Lab: 33 g/dL — ABNORMAL LOW (ref 32.0–36.0)
Lab: 33 pg (ref 26–34)
Lab: 339 K/UL (ref 150–400)
Lab: 38 % (ref 36–45)
Lab: 7.7 FL — ABNORMAL HIGH (ref 7–11)

## 2019-08-30 LAB — COMPREHENSIVE METABOLIC PANEL
Lab: 141 MMOL/L (ref 137–147)
Lab: 27 MMOL/L (ref 21–30)
Lab: 4.1 MMOL/L (ref 3.5–5.1)
Lab: 45 U/L (ref 7–56)
Lab: >60 mL/min (ref >60)
Lab: >60 mL/min (ref >60)
Lab: 11 (ref 3–12)

## 2019-08-30 LAB — RESEARCH BLOOD COLLECTION ONLY

## 2019-08-30 NOTE — Research Notes
Research Study Visit Note    NAME: Penny Townsend             MRN: [id number]             DOB:10-Dec-1965         [facility] Number: [id number]  Study: Ghrelin (OXE-103) for acute concussion management  Intervention: Placebo vs OXE-103 subcutaneous injections twice daily for two weeks (double-blinded).      Study participant here for study visit day 8.  Completed all study related procedures per protocol, including labs.  Medical history, medications, treatments, and adverse events reviewed per protocol.  Subject confirms no missed doses.  Confirmed willingness to continue on study.  Provided second week of study medication to subject.  Confirmed next appt will be a phone call with ipad testing and questionnaires.     Next due: Day 11.

## 2019-09-03 ENCOUNTER — Ambulatory Visit: Admit: 2019-09-03 | Discharge: 2019-09-03 | Payer: Worker's Compensation

## 2019-09-03 ENCOUNTER — Encounter: Admit: 2019-09-03 | Discharge: 2019-09-03

## 2019-09-03 DIAGNOSIS — O039 Complete or unspecified spontaneous abortion without complication: Secondary | ICD-10-CM

## 2019-09-03 DIAGNOSIS — G43909 Migraine, unspecified, not intractable, without status migrainosus: Secondary | ICD-10-CM

## 2019-09-03 DIAGNOSIS — I499 Cardiac arrhythmia, unspecified: Secondary | ICD-10-CM

## 2019-09-03 DIAGNOSIS — G479 Sleep disorder, unspecified: Secondary | ICD-10-CM

## 2019-09-03 DIAGNOSIS — R413 Other amnesia: Secondary | ICD-10-CM

## 2019-09-03 DIAGNOSIS — R002 Palpitations: Secondary | ICD-10-CM

## 2019-09-03 DIAGNOSIS — S060X0D Concussion without loss of consciousness, subsequent encounter: Secondary | ICD-10-CM

## 2019-09-03 DIAGNOSIS — R1319 Other dysphagia: Secondary | ICD-10-CM

## 2019-09-03 DIAGNOSIS — J45909 Unspecified asthma, uncomplicated: Secondary | ICD-10-CM

## 2019-09-03 DIAGNOSIS — G43719 Chronic migraine without aura, intractable, without status migrainosus: Secondary | ICD-10-CM

## 2019-09-03 DIAGNOSIS — R079 Chest pain, unspecified: Secondary | ICD-10-CM

## 2019-09-03 DIAGNOSIS — M1990 Unspecified osteoarthritis, unspecified site: Secondary | ICD-10-CM

## 2019-09-03 MED ORDER — PROPRANOLOL 10 MG PO TAB
10 mg | ORAL_TABLET | Freq: Two times a day (BID) | ORAL | 6 refills | Status: AC
Start: 2019-09-03 — End: ?

## 2019-09-03 NOTE — Progress Notes
Date of Service: 09/03/2019    Subjective:             Penny Townsend is a 54 y.o. female here for follow up due to headaches.     History of Present Illness  Penny Townsend is a 54 y.o. female with a past medical history of cervical spondylosis, asthma, recent concussion and OSA (on CPAP), here for follow up due to chronic headaches. Patient describes headaches for years, they are usually localized on occipital area (more frontal lately), pain described as sharp/tightness sensation, headaches usually last for several hours, they are associated with nausea, phonophobia and photophobia (more frequent/intense lately); but she denies aura or visual/autonomic symptoms with them. Stress and positions (leaning forwards) may trigger headaches but she denies new triggers besides recent head trauma lately. Headache frequency has been 7 episodes a week, but moderate/severe migraines may happen 4 times a month (worse than before) and severity could be up to 7/10. Headaches are overall worse lately since patient sustained head trauma last 07/2019 (she was confused after episode, but is unclear if she passed out during event). She also reports dizziness and neck pain since the fall, but denies recent balance issues or weakness/numbness. Brain imaging [facility] after trauma) was unremarkable per patient. She denies recent mood changes but reports persistent sleep problems. She has been doing vestibular therapy since recent trauma with some benefit.      Headache medications:  Preventive:   Emgality (some benefit reported in the past)  Propranolol 10mg  qhs (denies clear benefit for headaches or SE)  Gabapentin 800mg  tid (reports benefit with higher dose, denies SE)  Amitriptyline 150mg  qhs (reports some benefit for sleep and with headaches and denies side effects other than dry mouth)   Magnesium glycinate 300mg  qday, Riboflavin 200mg  qday and B12 1mg  (reported benefit)  She tried Botox with some benefit (had injections for a year, headaches were less frequent for a few months, but last 2 sessions did not help).  Tried muscle relaxants, Duloxetine and Topiramate but denies benefit.       Abortive:   Tylenol ER with some benefit (takes around 1-2 tabs a day), she takes Zofran with some benefit. She denies taking any other over-the-counter medication recently.   Bernita Raisin (denies clear benefit)  Rizatriptan (reports some benefit).         Review of Systems   Constitutional: Positive for activity change and appetite change.   HENT: Positive for congestion and ear pain.    Eyes: Positive for pain, itching and visual disturbance.   Respiratory: Positive for shortness of breath and wheezing.    Cardiovascular: Positive for chest pain and leg swelling.   Gastrointestinal: Positive for abdominal distention, constipation, nausea and vomiting.   Endocrine: Positive for cold intolerance.   Musculoskeletal: Positive for arthralgias, myalgias, neck pain and neck stiffness.   Allergic/Immunologic: Positive for environmental allergies and food allergies.   Neurological: Positive for dizziness, light-headedness and headaches.   Psychiatric/Behavioral: Positive for decreased concentration and sleep disturbance. The patient is nervous/anxious.    All other systems reviewed and are negative.    Medical History:   Diagnosis Date   ? Arthritis    ? Asthma    ? Cardiac dysrhythmia    ? Chest pain    ? Heart palpitations    ? Memory loss    ? Migraines    ? Miscarriage     x2   ? Other dysphagia    ? Sleep disorder  Surgical History:   Procedure Laterality Date   ? THORACENTESIS N/A 04/15/2016    Performed by Nell Range, MD at [facility] ENDO   ? BREATH HYDROGEN/ methane testing to rule out bacterial overgrowth N/A 05/24/2017    Performed by Normajean Baxter, MD at [facility] ENDO   ? CESAREAN SECTION     ? ROTATOR CUFF REPAIR       Social History     Socioeconomic History   ? Marital status: Married     Spouse name: Not on file   ? Number of children: Not on file   ? Years of education: Not on file   ? Highest education level: Not on file   Occupational History   ? Occupation: educator   Tobacco Use   ? Smoking status: Never Smoker   ? Smokeless tobacco: Never Used   Substance and Sexual Activity   ? Alcohol use: Yes     Frequency: Monthly or less     Comment: RARELY   ? Drug use: No   ? Sexual activity: Yes     Partners: Male     Birth control/protection: Post-menopausal   Other Topics Concern   ? Not on file   Social History Narrative    Lives with husband and 2 kids (adopted 2 kids). Has 1 biologic daughter. No DV.  Works at [employer].  Volunteers at [employer]     Family History   Problem Relation Age of Onset   ? Heart problem Father    ? Cancer Father         liver   ? Heart Attack Father    ? Thyroid Disease Father    ? Diabetes Father    ? GI Cancer Father         liver CA   ? Cancer Mother         pancreatic   ? Thyroid Disease Mother    ? Diabetes Mother    ? GI Cancer Mother         pancreatic CA   ? Colon Polyps Mother    ? Other Sister         GI issues   ? Other Brother         GI issues   ? Melanoma Brother    ? Other Brother         GI issues   ? Celiac Disease Neg Hx    ? Cancer-Colon Neg Hx    ? Inflammatory Bowel Disease Neg Hx    ? Rectal Cancer Neg Hx    ? Ulcerative Colitis Neg Hx      ALLERGIES  Allergies   Allergen Reactions   ? Morphine DELUSIONS   ? Other [Unclassified Drug] SWOLLEN TONGUE     Ground beef   ? Oxycodone DELUSIONS   ? Gluten STOMACH UPSET   ? Mango SEE COMMENTS     Swollen lip     Objective:         ? acetaminophen SR (TYLENOL) 650 mg tablet Take 650 mg by mouth every 6 hours as needed for Pain.   ? amitriptyline (ELAVIL) 100 mg tablet TAKE 1&1/2 TABS BY MOUTH EVERY NIGHT AT BEDTIME   ? ergocalciferol (vitamin D2) (VITAMIN D PO) Take  by mouth daily.   ? fexofenadine(+) (ALLEGRA) 180 mg tablet Take 180 mg by mouth daily.   ? gabapentin (NEURONTIN) 800 mg tablet Take one tablet by mouth every 8 hours.   ?  galcanezumab-gnlm (EMGALITY) 120 mg/mL subcutaneous PEN Inject 1 mL under the skin every 30 days.   ? ginger root (GINGER EXTRACT PO) Take  by mouth daily.   ? magnesium oxide 400 mg magnesium tab Take 1 tablet by mouth daily.   ? omeprazole DR (PRILOSEC) 40 mg capsule TAKE 1 CAPSULE BY MOUTH TWICE DAILY 30 MINUTES TO 1 HOUR PRIOR TO FIRST AND LAST MEAL OF THE DAY   ? other medication 1 Dose once. Medication Name & Strength: Tumeric    Dose(how many): tablet    Frequency(how often): Daily   ? propranoloL (INDERAL) 10 mg tablet TAKE 1 TABLET BY MOUTH THREE TIMES A DAY   ? riboflavin (vitamin B2) 400 mg tab Take one tablet by mouth daily.   ? rizatriptan (MAXALT) 10 mg tablet Take one tablet by mouth daily as needed. May repeat in 2 hours in needed   ? ubrogepant (UBRELVY) 50 mg tablet Take one tablet by mouth daily as needed. May repeat once after 2 hours based on response.     Vitals:    09/03/19 1331   BP: 106/68   BP Source: Arm, Left Upper   Patient Position: Sitting   Pulse: 77   Weight: 69.9 kg (154 lb 3.2 oz)   Height: 165.1 cm (65)   PainSc: Seven     Body mass index is 25.66 kg/m?Marland Kitchen     Physical Exam  GENERAL APPEARANCE: The patient is alert. Patient is in no acute distress, following commands and cooperative. Well developed and well nourished.   HEENT: Normocephalic and atraumatic. No clear tenderness to palpation over frontal/temporal/occipital area.  EXTREMITIES: no leg swelling.    NEUROLOGIC EXAM:   Orientation: The patient is alert and oriented times three. Patient is following commands. Speech is fluent, intact comprehension, repetition and naming.     Cranial nerves:  1st cranial nerve:  not tested   2nd cranial nerve: normal; Visual fields are full to confrontation. Pupils are equal, round, and reactive to light and accommodation.   3rd, 4th & 6th cranial nerves: Extraocular movements are intact with no clear horizontal nystagmus, but some saccadic movements seen.  5th cranial nerve: normal; intact muscles of mastication. Intact light touch and pin prick.  7th cranial nerve: normal; no facial asymmetry  8th cranial nerve: normal  9th & 10th cranial nerves: normal; Gag is present   11th cranial nerve: normal; Shoulder shrug symmetric   12th cranial nerve: normal; tongue is midline.      Strength: (Right/Left) Deltoid 5/5, Biceps 5/5, Triceps 5/5, Finger ext 5/5, interossei 5/5, Hip Flexion 5/5, Knee ext 5/5, Knee flex 5/5, Ankle dorsiflexion 5/5, Ankle plantarflexion 5/5. Normal bulk and tone in all four limbs without any evidence of an arm drift.  No abnormal movements during exam.  Sensory: Intact to pain, temperature and vibration in both upper/lower extremities.   Coordination is intact finger-to-nose and rapidly alternating movements.    Deep tendon reflexes are 2+ in biceps, triceps, brachioradialis and patellar bilaterally and 1+ ankle reflex.  Hoffman sign is absent bilaterally. Clonus was not elicited.   Gait is normal based without ataxia. Fair tandem walking.    LABS:  Sodium   Date Value Ref Range Status   08/30/2019 141 137 - 147 MMOL/L Final     Potassium   Date Value Ref Range Status   08/30/2019 4.1 3.5 - 5.1 MMOL/L Final     Chloride   Date Value Ref Range Status   08/30/2019 103  98 - 110 MMOL/L Final     CO2   Date Value Ref Range Status   08/30/2019 27 21 - 30 MMOL/L Final     Anion Gap   Date Value Ref Range Status   08/30/2019 11 3 - 12 Final     Blood Urea Nitrogen   Date Value Ref Range Status   08/30/2019 10 7 - 25 MG/DL Final     Creatinine   Date Value Ref Range Status   08/30/2019 0.80 0.4 - 1.00 MG/DL Final     Glucose   Date Value Ref Range Status   08/30/2019 146 (H) 70 - 100 MG/DL Final     Calcium   Date Value Ref Range Status   08/30/2019 9.4 8.5 - 10.6 MG/DL Final     AST (SGOT)   Date Value Ref Range Status   08/30/2019 42 (H) 7 - 40 U/L Final     ALT (SGPT)   Date Value Ref Range Status   08/30/2019 45 7 - 56 U/L Final     Alk Phosphatase   Date Value Ref Range Status 08/30/2019 98 25 - 110 U/L Final     Albumin   Date Value Ref Range Status   08/30/2019 4.1 3.5 - 5.0 G/DL Final     Total Bilirubin   Date Value Ref Range Status   08/30/2019 0.2 (L) 0.3 - 1.2 MG/DL Final     Creatine Kinase   Date Value Ref Range Status   04/14/2016 23 21 - 215 U/L Final     Cholesterol   Date Value Ref Range Status   03/20/2018 199 <200 MG/DL Final     Triglycerides   Date Value Ref Range Status   03/20/2018 63 <150 MG/DL Final     HDL   Date Value Ref Range Status   03/20/2018 64 >40 MG/DL Final     LDL   Date Value Ref Range Status   03/20/2018 115 (H) <100 mg/dL Final     VLDL   Date Value Ref Range Status   03/20/2018 13 MG/DL Final     TSH   Date Value Ref Range Status   03/20/2018 1.10 0.35 - 5.00 MCU/ML Final     Assessment and Plan:  Chronic migraines and recent concussion. Patient thinks that her headaches have been worse in the last few weeks after concussion. She reports some benefit with preventive agents including Emgality injections and Gabapentin use in the past, but not lately; however patient reports good tolerance with current agents. She also noticed dizziness after fall, but this has improved with recent PT (vestibular therapy). Neck pain is similar, but she denies recent weakness, paresthesia, balance problems or recent falls. Patient is also involved on a research study as part of recent concussion. Neurological exam seems stable today. We will try a higher dose of propranolol to try to optimize headache control.     Recommendations:  1. We will increase propranolol dose up to 10mg  bid for headache prophylaxis. The patient was advised about common side effects with this medication use. We may titrate this medication up in the next few months if needed.   2. We will also use Rizatriptan and a limited amount of NSAIDs as abortive medications for headaches.   3. Follow-up appointment in 4-6 months.     Total time was 30 minutes. This time was spent preparing to see the patient, obtaining and/or reviewing history, performing an examination, ordering medications, tests/ procedures, communicating results  to the patient, documenting clinical information in the electronic health record and counseling/educating the patient regarding headaches.

## 2019-09-03 NOTE — Research Notes
Research Study Visit Note (PHONE CALL)    NAME: Penny Townsend             MRN: [id number]             DOB:1966-01-02         [facility] Number: [id number]  Study: Ghrelin (OXE-103) for acute concussion management  Intervention: OXE-103 subcutaneous injections twice daily for two weeks     Spoke with subject on phone for study Day 11.  Confirmed subject was not fasting for labs done at Day 8.  Confirmed no missed doses, continuing on study drug per protocol.  Discussed questionnaires, neurocog testing, and side effects.  Side effects noted by subject continue to be mild GI-related and expected per protocol.  Discussed next study appt and study procedures and testing.       Next due: Day 15.

## 2019-09-04 ENCOUNTER — Encounter: Admit: 2019-09-04 | Discharge: 2019-09-04

## 2019-09-04 NOTE — Telephone Encounter
I see her Thursday, let's discuss it then

## 2019-09-04 NOTE — Telephone Encounter
-----   Message from [employer]. Nolden sent at 09/03/2019  9:18 PM CDT -----  Regarding: Visit Follow-Up Question  Contact: [phone]  I'd like to resume my 6 hours of work a day.  I saw Dr. Fuller Mandril today.    Let me know what you think.  Thank you, Penny Townsend

## 2019-09-05 ENCOUNTER — Encounter: Admit: 2019-09-05 | Discharge: 2019-09-05

## 2019-09-05 DIAGNOSIS — S060X0D Concussion without loss of consciousness, subsequent encounter: Secondary | ICD-10-CM

## 2019-09-06 ENCOUNTER — Encounter: Admit: 2019-09-06 | Discharge: 2019-09-06

## 2019-09-06 ENCOUNTER — Ambulatory Visit: Admit: 2019-09-06 | Discharge: 2019-09-06 | Payer: Worker's Compensation

## 2019-09-06 ENCOUNTER — Ambulatory Visit: Admit: 2019-09-06 | Discharge: 2019-09-06 | Payer: Commercial Managed Care - PPO

## 2019-09-06 DIAGNOSIS — G479 Sleep disorder, unspecified: Secondary | ICD-10-CM

## 2019-09-06 DIAGNOSIS — S060X0D Concussion without loss of consciousness, subsequent encounter: Secondary | ICD-10-CM

## 2019-09-06 DIAGNOSIS — M1990 Unspecified osteoarthritis, unspecified site: Secondary | ICD-10-CM

## 2019-09-06 DIAGNOSIS — O039 Complete or unspecified spontaneous abortion without complication: Secondary | ICD-10-CM

## 2019-09-06 DIAGNOSIS — R1319 Other dysphagia: Secondary | ICD-10-CM

## 2019-09-06 DIAGNOSIS — R002 Palpitations: Secondary | ICD-10-CM

## 2019-09-06 DIAGNOSIS — J45909 Unspecified asthma, uncomplicated: Secondary | ICD-10-CM

## 2019-09-06 DIAGNOSIS — M542 Cervicalgia: Secondary | ICD-10-CM

## 2019-09-06 DIAGNOSIS — R413 Other amnesia: Secondary | ICD-10-CM

## 2019-09-06 DIAGNOSIS — R079 Chest pain, unspecified: Secondary | ICD-10-CM

## 2019-09-06 DIAGNOSIS — G43909 Migraine, unspecified, not intractable, without status migrainosus: Secondary | ICD-10-CM

## 2019-09-06 DIAGNOSIS — I499 Cardiac arrhythmia, unspecified: Secondary | ICD-10-CM

## 2019-09-06 LAB — CBC
Lab: 3.9 M/UL — ABNORMAL LOW (ref 4.0–5.0)
Lab: 8.5 10*3/uL (ref 4.5–11.0)

## 2019-09-06 LAB — COMPREHENSIVE METABOLIC PANEL
Lab: 0.3 mg/dL (ref 0.3–1.2)
Lab: 0.7 mg/dL (ref 0.4–1.00)
Lab: 10 mg/dL (ref 7–25)
Lab: 103 MMOL/L — ABNORMAL HIGH (ref 98–110)
Lab: 118 mg/dL — ABNORMAL HIGH (ref 70–100)
Lab: 140 MMOL/L (ref 137–147)
Lab: 25 MMOL/L (ref 21–30)
Lab: 4.2 MMOL/L (ref 3.5–5.1)
Lab: 4.3 g/dL (ref 3.5–5.0)
Lab: 51 U/L — ABNORMAL HIGH (ref 7–40)
Lab: 65 U/L — ABNORMAL HIGH (ref 7–56)
Lab: 7.5 g/dL (ref 6.0–8.0)
Lab: 9.9 mg/dL (ref 8.5–10.6)
Lab: 96 U/L (ref 25–110)
Lab: >60 mL/min (ref >60)
Lab: >60 mL/min (ref >60)
Lab: 12 (ref 3–12)

## 2019-09-06 NOTE — Progress Notes
Date of Service: 09/06/2019    Subjective:             Penny Townsend is a 54 y.o. female.    History of Present Illness  Penny Townsend returns for follow up.  She reports she is way better in all aspects.  She still has her normal headaches which pre-existed this injury.  Her eye headaches are a lot better.  She has been to PT 3 times now.  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       Review of Systems   Constitutional: Negative.    HENT: Positive for ear pain, sore throat and tinnitus.    Eyes: Positive for redness.   Respiratory: Positive for apnea, chest tightness, shortness of breath and wheezing.    Cardiovascular: Positive for leg swelling.   Gastrointestinal: Positive for abdominal distention, abdominal pain and constipation.   Endocrine: Negative.    Genitourinary: Negative.    Musculoskeletal: Positive for back pain, neck pain and neck stiffness.   Skin: Negative.    Allergic/Immunologic: Negative.    Neurological: Positive for headaches.   Hematological: Negative.    Psychiatric/Behavioral: Negative.          Objective:         ? acetaminophen SR (TYLENOL) 650 mg tablet Take 650 mg by mouth every 6 hours as needed for Pain.   ? amitriptyline (ELAVIL) 100 mg tablet TAKE 1&1/2 TABS BY MOUTH EVERY NIGHT AT BEDTIME   ? ergocalciferol (vitamin D2) (VITAMIN D PO) Take  by mouth daily.   ? fexofenadine(+) (ALLEGRA) 180 mg tablet Take 180 mg by mouth daily.   ? gabapentin (NEURONTIN) 800 mg tablet Take one tablet by mouth every 8 hours.   ? galcanezumab-gnlm (EMGALITY) 120 mg/mL subcutaneous PEN Inject 1 mL under the skin every 30 days.   ? ginger root (GINGER EXTRACT PO) Take  by mouth daily.   ? magnesium oxide 400 mg magnesium tab Take 1 tablet by mouth daily.   ? omeprazole DR (PRILOSEC) 40 mg capsule TAKE 1 CAPSULE BY MOUTH TWICE DAILY 30 MINUTES TO 1 HOUR PRIOR TO FIRST AND LAST MEAL OF THE DAY   ? other medication 1 Dose once. Medication Name & Strength: Tumeric    Dose(how many): tablet    Frequency(how often): Daily   ? propranoloL (INDERAL) 10 mg tablet Take one tablet by mouth twice daily.   ? riboflavin (vitamin B2) 400 mg tab Take one tablet by mouth daily.   ? rizatriptan (MAXALT) 10 mg tablet Take one tablet by mouth daily as needed. May repeat in 2 hours in needed   ? ubrogepant (UBRELVY) 50 mg tablet Take one tablet by mouth daily as needed. May repeat once after 2 hours based on response.     Vitals:    09/06/19 1251   BP: 117/81   BP Source: Arm, Left Upper   Patient Position: Sitting   Pulse: 108   Temp: 36.3 ?C (97.3 ?F)   Weight: 68.9 kg (152 lb)   Height: 165.1 cm (65)   PainSc: Three     Body mass index is 25.29 kg/m?Marland Kitchen     Physical Exam  Vitals reviewed.   Constitutional:       Appearance: Normal appearance.   HENT:      Head: Normocephalic and atraumatic.   Cardiovascular:      Rate and Rhythm: Normal rate and regular rhythm.   Musculoskeletal:  General: No swelling.   Neurological:      Mental Status: She is alert.           Neuro Exam:  Mental Status: A&Ox3  Speech: fluent without dysarthria  CN: visual fields full to confrontation, PERRLA, EOMI, strength of facial muscles is full and symmetric, hearing intact to conversation, symmetric palate elevation, tongue midline  Motor: normal tone/bulk, strength 5/5 throughout  Coordination: normal finger to nose bilaterally  Gait: normal    Concussion Symptom Checklist Score:  Concussion Symptom Checklist 09/06/2019 08/24/2019   Headache 3 3   Balance Problem 0 0   Dizziness 0 2   Fatigue 0 4   Trouble Falling Asleep 3 6   Sleeping more than usual 0 0   Sleeping less than usual 3 0   Drowsiness 0 4   Sensitivity To Light 3 3   Sensitivity To Noise 1 3   Irritability 0 0   Sadness 0 2   Nervousness 2 2   More Emotional 0 0   Numbness or tingling and/or neck pain 2 4   Feeling Slowed Down 0 2   Feeling Mentally Foggy 0 0   Difficulty Concentrating 1 2   Difficult Remembering 0 0   Visual Problems 0 0   Neck Pain 4 5   Total Symptoms Score 22 43          Assessment and Plan:  1. Concussion without loss of consciousness, subsequent encounter     2. Cervicalgia       Penny Townsend is significantly improved.    Plan:  1. Continue vestibular therapy    Work status:  She may return to work with limited restrictions:    ? Work day must not exceed 6 hrs for the next week.    ? Starting 5/20--may work unrestricted hours    RTC in 3 weeks

## 2019-09-06 NOTE — Progress Notes
AST has continued to increase as has ALT which is now out of normal range as well.  Will need to monitor these at next lab after off study drug and perhaps again at last follow up if not normalized.

## 2019-09-07 NOTE — Research Notes
Research Study Visit Note    NAME: Penny Townsend             MRN: [id number]             DOB:[birth year]         [facility] Number: [id number]  Study: Ghrelin (OXE-103) for acute concussion management  Intervention: Placebo vs OXE-103 subcutaneous injections twice daily for two weeks (double-blinded).      Study participant here for study visit day 15 (today is actually day 14).  Confirmed subject fasting for labs.  Completed all study related tests and procedures per protocol.  Medical history, medications, treatments, and adverse events reviewed per protocol.  Subject confirms no missed doses.  Has one more dose (PM dose) left of two-week study drug treatment.     Next due: Day 21.

## 2019-09-10 ENCOUNTER — Encounter: Admit: 2019-09-10 | Discharge: 2019-09-10

## 2019-09-10 MED FILL — GALCANEZUMAB-GNLM 120 MG/ML SC PNIJ: 120 mg/mL | SUBCUTANEOUS | 30 days supply | Qty: 1 | Fill #5 | Status: AC

## 2019-09-13 ENCOUNTER — Encounter: Admit: 2019-09-13 | Discharge: 2019-09-13

## 2019-09-13 ENCOUNTER — Ambulatory Visit: Admit: 2019-09-13 | Discharge: 2019-09-13 | Payer: Commercial Managed Care - PPO

## 2019-09-13 DIAGNOSIS — S060X0D Concussion without loss of consciousness, subsequent encounter: Secondary | ICD-10-CM

## 2019-09-13 LAB — COMPREHENSIVE METABOLIC PANEL
Lab: 0.3 mg/dL (ref 0.3–1.2)
Lab: 0.8 mg/dL (ref 0.4–1.00)
Lab: 100 U/L (ref 25–110)
Lab: 115 mg/dL — ABNORMAL HIGH (ref 70–100)
Lab: 139 MMOL/L (ref 137–147)
Lab: 14 mg/dL (ref 7–25)
Lab: 29 MMOL/L (ref 21–30)
Lab: 3.9 MMOL/L (ref 3.5–5.1)
Lab: 4.3 g/dL (ref 3.5–5.0)
Lab: 62 U/L — ABNORMAL HIGH (ref 7–40)
Lab: 68 U/L — ABNORMAL HIGH (ref 7–56)
Lab: 7.6 g/dL (ref 6.0–8.0)
Lab: 9 (ref 3–12)
Lab: 9.7 mg/dL (ref 8.5–10.6)
Lab: >60 mL/min (ref >60)
Lab: >60 mL/min (ref >60)

## 2019-09-13 LAB — CBC
Lab: 3.9 M/UL — ABNORMAL LOW (ref 4.0–5.0)
Lab: 6.4 10*3/uL (ref 4.5–11.0)

## 2019-09-13 LAB — RESEARCH BLOOD COLLECTION ONLY

## 2019-09-13 NOTE — Progress Notes
Study visit exam:    Physical Exam  Vitals reviewed.   Constitutional:       Appearance: Normal appearance.   HENT:      Head: Normocephalic and atraumatic.   Cardiovascular:      Rate and Rhythm: Normal rate and regular rhythm.   Musculoskeletal:         General: No swelling.   Neurological:      Mental Status: She is alert.    ?      Neuro Exam:  Mental Status: A&Ox3  Speech: fluent without dysarthria  CN: visual fields full to confrontation, PERRLA, EOMI, strength of facial muscles is full and symmetric, hearing intact to conversation, symmetric palate elevation, tongue midline  Motor: normal tone/bulk, strength 5/5 throughout  Coordination: normal finger to nose bilaterally  Gait: normal

## 2019-09-13 NOTE — Research Notes
Research Study Visit Note    NAME: Penny Townsend             MRN: [id number]             DOB:26-May-1965         [facility] Number: [id number]  Study: Ghrelin (OXE-103) for acute concussion management  Intervention: OXE-103 subcutaneous injections twice daily for two weeks    Study participant here for study visit day 21.  Confirmed subject fasting for labs.  Completed all study related procedures per protocol.  Medical history, medications, treatments, and adverse events reviewed per protocol.     Per PI, CMP shows slightly elevated liver enzymes.  Per protocol, will have subject repeat labs in ~7 days until labs normalize.    Next study visit due: Day 44 (end of study).

## 2019-09-18 ENCOUNTER — Encounter: Admit: 2019-09-18 | Discharge: 2019-09-18

## 2019-09-18 DIAGNOSIS — S060X0D Concussion without loss of consciousness, subsequent encounter: Secondary | ICD-10-CM

## 2019-09-20 ENCOUNTER — Ambulatory Visit: Admit: 2019-09-20 | Discharge: 2019-09-20 | Payer: Worker's Compensation

## 2019-09-20 ENCOUNTER — Encounter: Admit: 2019-09-20 | Discharge: 2019-09-20

## 2019-09-20 DIAGNOSIS — S060X0D Concussion without loss of consciousness, subsequent encounter: Secondary | ICD-10-CM

## 2019-09-20 LAB — COMPREHENSIVE METABOLIC PANEL
Lab: 0.3 mg/dL (ref 0.3–1.2)
Lab: 0.8 mg/dL (ref 0.4–1.00)
Lab: 102 MMOL/L (ref 98–110)
Lab: 107 U/L (ref 25–110)
Lab: 141 MMOL/L (ref 137–147)
Lab: 17 mg/dL (ref 7–25)
Lab: 23 U/L (ref 7–40)
Lab: 26 U/L (ref 7–56)
Lab: 31 MMOL/L — ABNORMAL HIGH (ref 21–30)
Lab: 4.1 MMOL/L (ref 3.5–5.1)
Lab: 4.2 g/dL (ref 3.5–5.0)
Lab: 7.4 g/dL (ref 6.0–8.0)
Lab: 9.4 mg/dL (ref 8.5–10.6)
Lab: 98 mg/dL (ref 70–100)
Lab: >60 mL/min (ref >60)
Lab: 8 (ref 3–12)

## 2019-09-25 ENCOUNTER — Encounter: Admit: 2019-09-25 | Discharge: 2019-09-25

## 2019-09-26 ENCOUNTER — Encounter: Admit: 2019-09-26 | Discharge: 2019-09-26

## 2019-09-26 NOTE — Telephone Encounter
-----   Message from [employer]. Cirrincione sent at 09/25/2019  8:14 PM CDT -----  Regarding: Visit Follow-Up Question  Contact: [phone]  Hi Dr rippy. I'm having a lot of my same concussion problems happening. Like buzzing of the top of my head and my eyeballs are burning and having streaking pain.  Last week I had a really bad migraine That started on Wednesday and I didn't feel a lot better until the morning of Saturday.  The week before that I had a migraine on Thursday.  I have still been putting in hours at work despite my symptoms. 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      What's the best course of action?  Thanks for your help. Herbert Seta

## 2019-09-26 NOTE — Telephone Encounter
I would suggest continuing with physical therapy.  Can also add Magnesium (oxide or gluconate) 400-600mg  twice daily and Riboflavin (Vitamin B2) 400mg  once daily to try to treat headache symptoms

## 2019-09-27 ENCOUNTER — Encounter: Admit: 2019-09-27 | Discharge: 2019-09-27

## 2019-09-27 NOTE — Telephone Encounter
I see that, I forgot that she was seeing Dr. Arrie Aran for chronic migraine.  Looking at the list of what she is on, and already tried, I don't know that I have anything additional to offer.

## 2019-10-04 ENCOUNTER — Encounter: Admit: 2019-10-04 | Discharge: 2019-10-04

## 2019-10-04 ENCOUNTER — Ambulatory Visit: Admit: 2019-10-04 | Discharge: 2019-10-04 | Payer: Worker's Compensation

## 2019-10-04 DIAGNOSIS — R1319 Other dysphagia: Secondary | ICD-10-CM

## 2019-10-04 DIAGNOSIS — M542 Cervicalgia: Secondary | ICD-10-CM

## 2019-10-04 DIAGNOSIS — J45909 Unspecified asthma, uncomplicated: Secondary | ICD-10-CM

## 2019-10-04 DIAGNOSIS — G43909 Migraine, unspecified, not intractable, without status migrainosus: Secondary | ICD-10-CM

## 2019-10-04 DIAGNOSIS — S060X0D Concussion without loss of consciousness, subsequent encounter: Secondary | ICD-10-CM

## 2019-10-04 DIAGNOSIS — R002 Palpitations: Secondary | ICD-10-CM

## 2019-10-04 DIAGNOSIS — G479 Sleep disorder, unspecified: Secondary | ICD-10-CM

## 2019-10-04 DIAGNOSIS — O039 Complete or unspecified spontaneous abortion without complication: Secondary | ICD-10-CM

## 2019-10-04 DIAGNOSIS — I499 Cardiac arrhythmia, unspecified: Secondary | ICD-10-CM

## 2019-10-04 DIAGNOSIS — R079 Chest pain, unspecified: Secondary | ICD-10-CM

## 2019-10-04 DIAGNOSIS — M1990 Unspecified osteoarthritis, unspecified site: Secondary | ICD-10-CM

## 2019-10-04 DIAGNOSIS — R413 Other amnesia: Secondary | ICD-10-CM

## 2019-10-15 ENCOUNTER — Encounter: Admit: 2019-10-15 | Discharge: 2019-10-15

## 2019-10-16 MED FILL — GALCANEZUMAB-GNLM 120 MG/ML SC PNIJ: 120 mg/mL | SUBCUTANEOUS | 30 days supply | Qty: 1 | Fill #6 | Status: AC

## 2019-10-21 IMAGING — CR CHEST
2 series · 2 of 2 positions shown · non-contrast
Comparison: none

[chest pa]
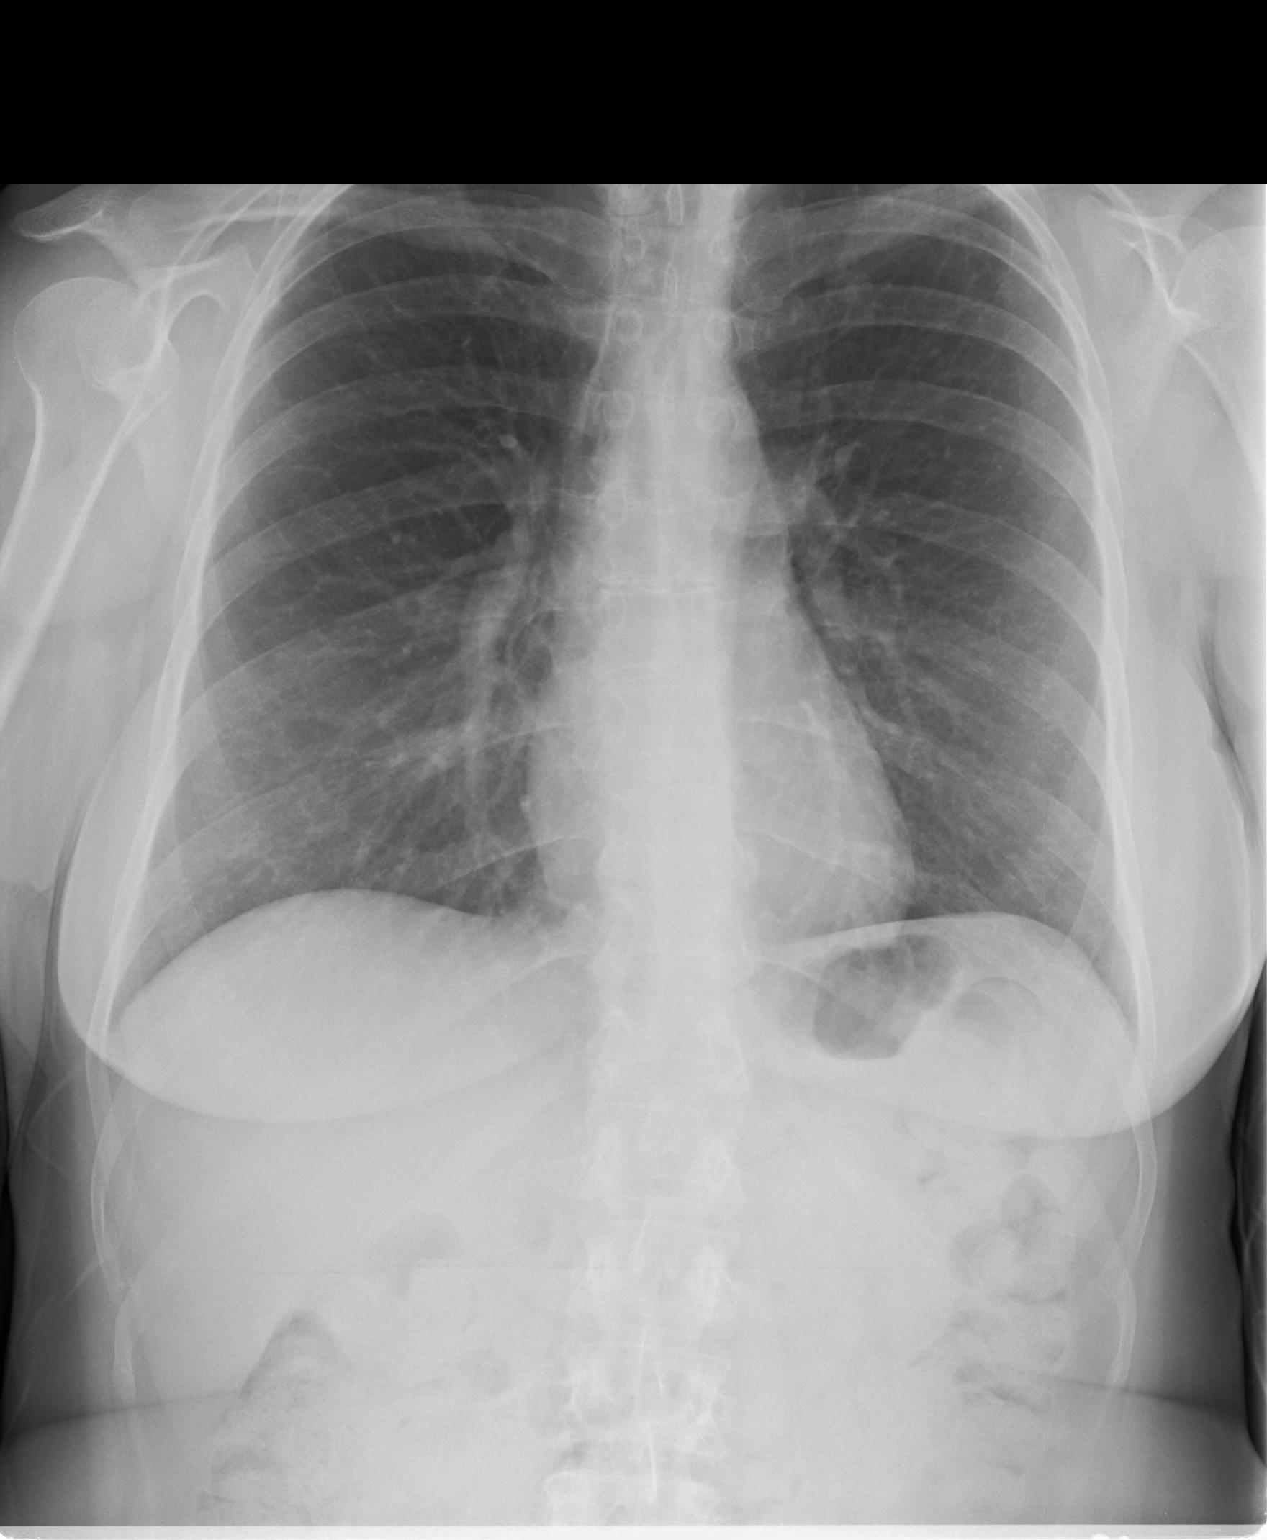

[chest lat]
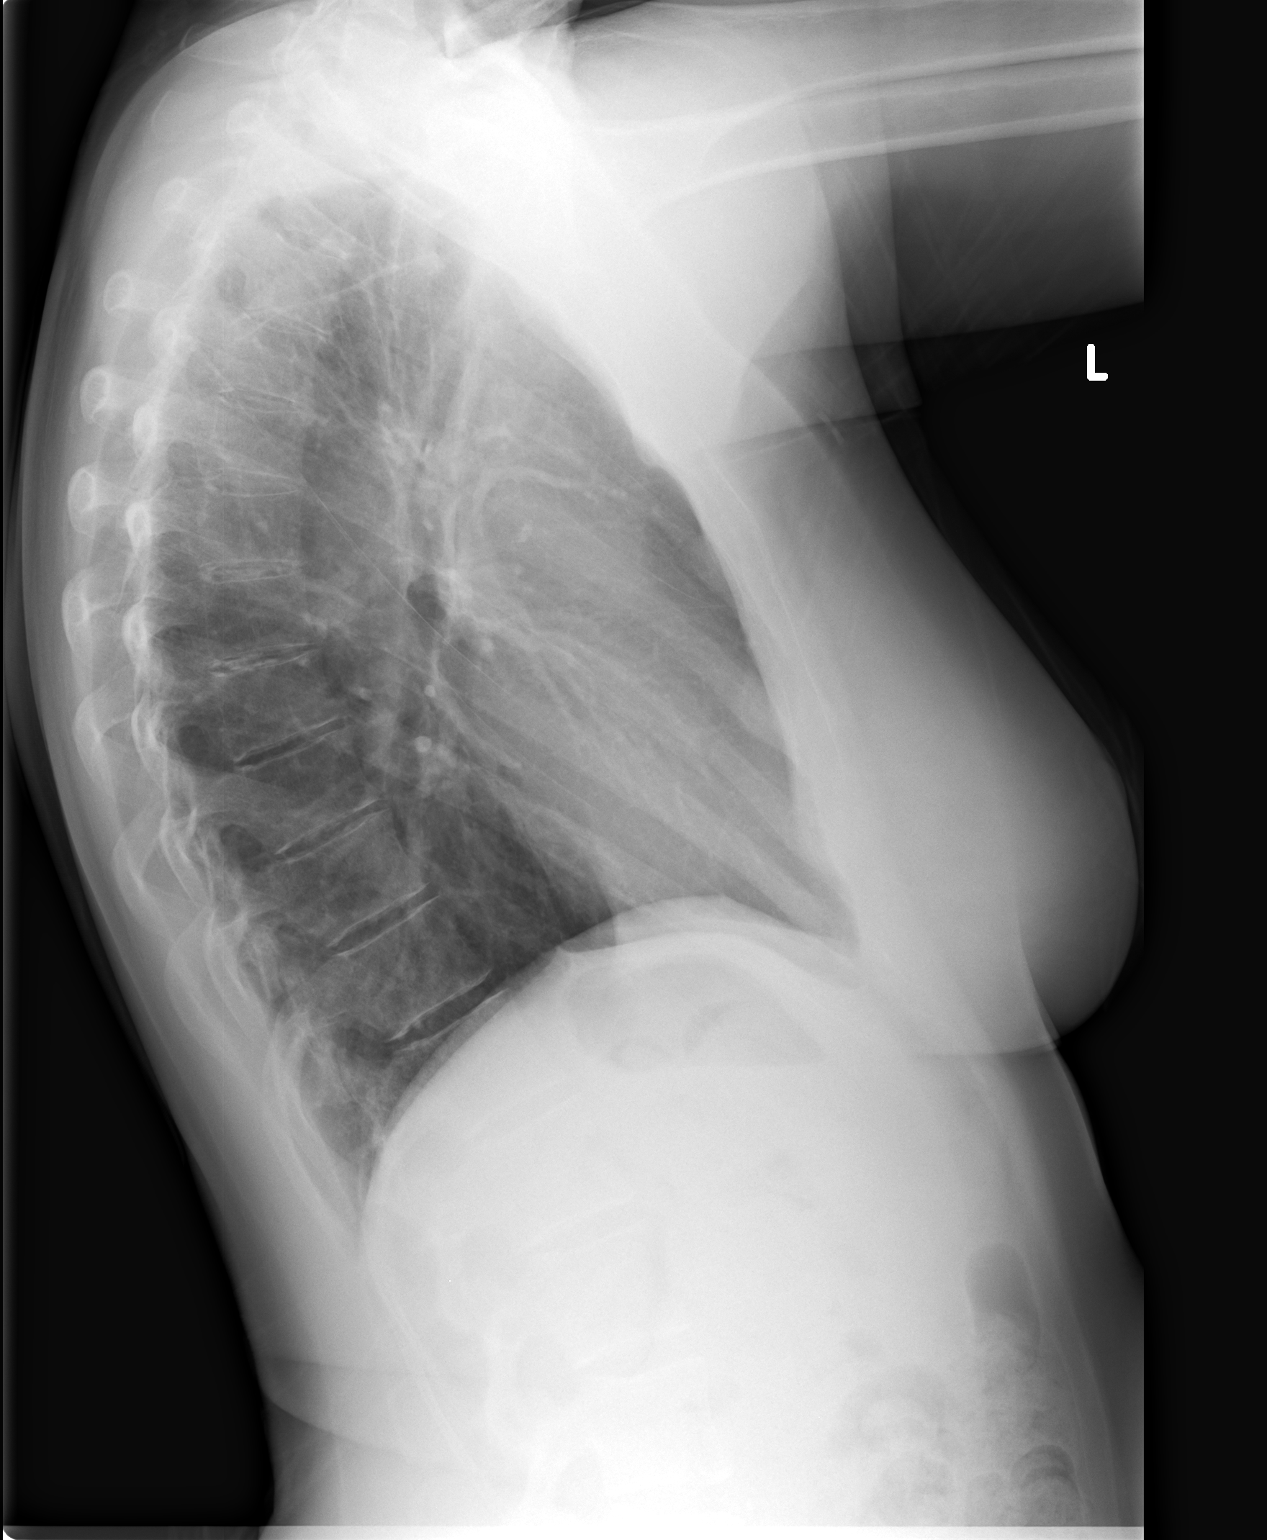

[2 of 2 positions shown; findings below may reference images not displayed]

DIAGNOSTIC STUDIES

EXAM

TWO VIEW CHEST

INDICATION

CHEST PAIN
PT. C/O CHEST PAIN AND BURNING X3 WEEKS.

TECHNIQUE

PA and lateral chest views

COMPARISONS

08/11/2016

FINDINGS

The cardiomediastinal silhouette and pulmonary vasculature are normal. No confluent infiltrate nor
pleural effusion is identified. Bony structures are unremarkable.

IMPRESSION

No acute chest process.

## 2019-11-05 ENCOUNTER — Encounter: Admit: 2019-11-05 | Discharge: 2019-11-05

## 2019-11-05 ENCOUNTER — Ambulatory Visit: Admit: 2019-11-05 | Discharge: 2019-11-06 | Payer: Worker's Compensation

## 2019-11-05 DIAGNOSIS — R002 Palpitations: Secondary | ICD-10-CM

## 2019-11-05 DIAGNOSIS — G479 Sleep disorder, unspecified: Secondary | ICD-10-CM

## 2019-11-05 DIAGNOSIS — R079 Chest pain, unspecified: Secondary | ICD-10-CM

## 2019-11-05 DIAGNOSIS — I499 Cardiac arrhythmia, unspecified: Secondary | ICD-10-CM

## 2019-11-05 DIAGNOSIS — G43909 Migraine, unspecified, not intractable, without status migrainosus: Secondary | ICD-10-CM

## 2019-11-05 DIAGNOSIS — R413 Other amnesia: Secondary | ICD-10-CM

## 2019-11-05 DIAGNOSIS — O039 Complete or unspecified spontaneous abortion without complication: Secondary | ICD-10-CM

## 2019-11-05 DIAGNOSIS — J45909 Unspecified asthma, uncomplicated: Secondary | ICD-10-CM

## 2019-11-05 DIAGNOSIS — R1319 Other dysphagia: Secondary | ICD-10-CM

## 2019-11-05 DIAGNOSIS — M1990 Unspecified osteoarthritis, unspecified site: Secondary | ICD-10-CM

## 2019-11-05 DIAGNOSIS — S060X0D Concussion without loss of consciousness, subsequent encounter: Secondary | ICD-10-CM

## 2019-11-05 NOTE — Progress Notes
Date of Service: 11/05/2019    Subjective:             Penny Townsend is a 54 y.o. female.    History of Present Illness  Penny Townsend returns for follow up.  She reports that her biggest issue of late is worsened migraines.  She had 3 migraines last week, 2 the week before that, and 5 the week before that.  They last all day.  She still has eye buzzing but did see optometry.  The recommendation was for eye drops (dry eyes) and prism lenses.  She has not yet received the lenses.  She did not do more PT as she didn't feel it was helpful.       Review of Systems   Constitutional: Positive for fatigue.   Eyes: Positive for photophobia, pain, discharge and itching.   Respiratory: Positive for chest tightness and wheezing.    Gastrointestinal: Positive for abdominal distention, abdominal pain, constipation, nausea and vomiting.   Endocrine: Positive for cold intolerance.   Musculoskeletal: Positive for arthralgias, neck pain and neck stiffness.   Allergic/Immunologic: Positive for environmental allergies and food allergies.   Neurological: Positive for dizziness, light-headedness and headaches.   Psychiatric/Behavioral: Positive for decreased concentration and sleep disturbance. The patient is nervous/anxious.    All other systems reviewed and are negative.        Objective:         ? acetaminophen SR (TYLENOL) 650 mg tablet Take 650 mg by mouth every 6 hours as needed for Pain.   ? amitriptyline (ELAVIL) 100 mg tablet TAKE 1&1/2 TABS BY MOUTH EVERY NIGHT AT BEDTIME   ? ergocalciferol (vitamin D2) (VITAMIN D PO) Take  by mouth daily.   ? fexofenadine(+) (ALLEGRA) 180 mg tablet Take 180 mg by mouth daily.   ? gabapentin (NEURONTIN) 800 mg tablet Take one tablet by mouth every 8 hours.   ? galcanezumab-gnlm (EMGALITY) 120 mg/mL subcutaneous PEN Inject 1 mL under the skin every 30 days.   ? magnesium oxide 400 mg magnesium tab Take 1 tablet by mouth daily.   ? omeprazole DR (PRILOSEC) 40 mg capsule TAKE 1 CAPSULE BY MOUTH TWICE DAILY 30 MINUTES TO 1 HOUR PRIOR TO FIRST AND LAST MEAL OF THE DAY   ? other medication 1 Dose once. Medication Name & Strength: Tumeric    Dose(how many): tablet    Frequency(how often): Daily   ? propranoloL (INDERAL) 10 mg tablet Take one tablet by mouth twice daily.   ? riboflavin (vitamin B2) 400 mg tab Take one tablet by mouth daily.   ? rizatriptan (MAXALT) 10 mg tablet Take one tablet by mouth daily as needed. May repeat in 2 hours in needed   ? ubrogepant (UBRELVY) 50 mg tablet Take one tablet by mouth daily as needed. May repeat once after 2 hours based on response.     Vitals:    11/05/19 1053   BP: 96/73   BP Source: Arm, Left Upper   Patient Position: Sitting   Pulse: 90   Weight: 65.8 kg (145 lb)   Height: 165.1 cm (65)   PainSc: Eight     Body mass index is 24.13 kg/m?Marland Kitchen     Physical Exam  Vitals reviewed.   Constitutional:       Appearance: Normal appearance.   HENT:      Head: Normocephalic and atraumatic.   Cardiovascular:      Rate and Rhythm: Normal rate and regular rhythm.  Musculoskeletal:         General: No swelling.   Neurological:      Mental Status: She is alert.           Neuro Exam:  Mental Status: A&Ox3  Speech: fluent without dysarthria  CN: visual fields full to confrontation, PERRLA, EOMI, strength of facial muscles is full and symmetric, hearing intact to conversation, symmetric palate elevation, tongue midline  Motor: normal tone/bulk, strength 5/5 throughout  Coordination: normal finger to nose bilaterally  Gait: normal    Concussion Symptom Checklist Score:  Concussion Symptom Checklist 11/05/2019 10/04/2019   Headache 3 3   Balance Problem 0 0   Dizziness 1 1   Fatigue 2 2   Trouble Falling Asleep 5 5   Sleeping more than usual 0 0   Sleeping less than usual 2 0   Drowsiness 2 2   Sensitivity To Light 4 3   Sensitivity To Noise 4 3   Irritability 2 1   Sadness 2 1   Nervousness 2 2   More Emotional 1 0   Numbness or tingling and/or neck pain 0 0   Feeling Slowed Down 2 1   Feeling Mentally Foggy 2 1   Difficulty Concentrating 2 1   Difficult Remembering 2 1   Visual Problems 0 0   Neck Pain 3 3   Total Symptoms Score 44 31     Total Symptoms Score: 44    Assessment and Plan:  1. Concussion without loss of consciousness, subsequent encounter       Penny Townsend has had worsening of her migraines.  These are a pre-existing issue and certainly cloud the overall concussion recovery.  She did have some vision issues noted by optometry.    Plan:  1. Can hold on vestibular therapy until she receives the prism lenses, may need to consider resuming once she has them though  2. Agree with prism lenses as recommended by Dr. Homero Fellers  3. Headache management will continue per Dr. Arrie Aran    Work status:  She may return to work with limited restrictions: ?  ? Work day must not exceed 6 hrs  ?  RTC in 6 weeks

## 2019-11-06 ENCOUNTER — Encounter: Admit: 2019-11-06 | Discharge: 2019-11-06

## 2019-11-08 ENCOUNTER — Encounter: Admit: 2019-11-08 | Discharge: 2019-11-08

## 2019-11-09 MED ORDER — EMGALITY PEN 120 MG/ML SC PNIJ
120 mg | SUBCUTANEOUS | 5 refills | Status: AC
Start: 2019-11-09 — End: ?
  Filled 2019-11-12: qty 1, 30d supply, fill #1

## 2019-11-12 ENCOUNTER — Encounter: Admit: 2019-11-12 | Discharge: 2019-11-12

## 2019-11-13 ENCOUNTER — Encounter: Admit: 2019-11-13 | Discharge: 2019-11-13

## 2019-11-25 ENCOUNTER — Encounter: Admit: 2019-11-25 | Discharge: 2019-11-25

## 2019-12-06 ENCOUNTER — Encounter: Admit: 2019-12-06 | Discharge: 2019-12-06

## 2019-12-07 ENCOUNTER — Encounter: Admit: 2019-12-07 | Discharge: 2019-12-07

## 2019-12-07 MED FILL — EMGALITY PEN 120 MG/ML SC PNIJ: 120 mg/mL | SUBCUTANEOUS | 30 days supply | Qty: 1 | Fill #2 | Status: AC

## 2019-12-10 ENCOUNTER — Encounter: Admit: 2019-12-10 | Discharge: 2019-12-10

## 2019-12-17 ENCOUNTER — Encounter: Admit: 2019-12-17 | Discharge: 2019-12-17

## 2019-12-17 ENCOUNTER — Ambulatory Visit: Admit: 2019-12-17 | Discharge: 2019-12-17 | Payer: Worker's Compensation

## 2019-12-17 DIAGNOSIS — R002 Palpitations: Secondary | ICD-10-CM

## 2019-12-17 DIAGNOSIS — G479 Sleep disorder, unspecified: Secondary | ICD-10-CM

## 2019-12-17 DIAGNOSIS — R413 Other amnesia: Secondary | ICD-10-CM

## 2019-12-17 DIAGNOSIS — G43909 Migraine, unspecified, not intractable, without status migrainosus: Secondary | ICD-10-CM

## 2019-12-17 DIAGNOSIS — I499 Cardiac arrhythmia, unspecified: Secondary | ICD-10-CM

## 2019-12-17 DIAGNOSIS — M1990 Unspecified osteoarthritis, unspecified site: Secondary | ICD-10-CM

## 2019-12-17 DIAGNOSIS — M542 Cervicalgia: Secondary | ICD-10-CM

## 2019-12-17 DIAGNOSIS — R079 Chest pain, unspecified: Secondary | ICD-10-CM

## 2019-12-17 DIAGNOSIS — S060X0D Concussion without loss of consciousness, subsequent encounter: Secondary | ICD-10-CM

## 2019-12-17 DIAGNOSIS — R1319 Other dysphagia: Secondary | ICD-10-CM

## 2019-12-17 DIAGNOSIS — J45909 Unspecified asthma, uncomplicated: Secondary | ICD-10-CM

## 2019-12-17 DIAGNOSIS — O039 Complete or unspecified spontaneous abortion without complication: Secondary | ICD-10-CM

## 2019-12-17 NOTE — Progress Notes
Date of Service: 12/17/2019    Subjective:             Penny Townsend is a 54 y.o. female.    History of Present Illness  Penny Townsend returns for follow up.  She notes that she is doing better.  She still has daily headaches, but they are not as severe.  She has felt more fatigued the last few weeks and like her brain is rattling.  She decreased her mid-day gabapentin dose and this seems to have lessened that feeling.    She still has not gotten her prism lenses as previously recommended.  She does not feel that her eyes are buzzing as much, but she still has eye pain.    She is working 6 hrs/day (which is her normal) without issues.       Review of Systems   Constitutional: Positive for fatigue.   HENT: Positive for postnasal drip, sneezing and sore throat.    Eyes: Positive for photophobia, pain, discharge, itching and visual disturbance.   Respiratory: Positive for chest tightness and shortness of breath.    Cardiovascular: Positive for chest pain.   Gastrointestinal: Positive for constipation and nausea.   Endocrine: Positive for cold intolerance.   Genitourinary: Negative.    Musculoskeletal: Positive for arthralgias, neck pain and neck stiffness.   Skin: Negative.    Allergic/Immunologic: Negative.    Neurological: Positive for dizziness, numbness and headaches.   Hematological: Negative.    Psychiatric/Behavioral: Positive for decreased concentration and sleep disturbance.         Objective:         ? acetaminophen SR (TYLENOL) 650 mg tablet Take 650 mg by mouth every 6 hours as needed for Pain.   ? amitriptyline (ELAVIL) 100 mg tablet TAKE 1&1/2 TABS BY MOUTH EVERY NIGHT AT BEDTIME   ? ergocalciferol (vitamin D2) (VITAMIN D PO) Take  by mouth daily.   ? fexofenadine(+) (ALLEGRA) 180 mg tablet Take 180 mg by mouth daily.   ? gabapentin (NEURONTIN) 800 mg tablet Take one tablet by mouth every 8 hours.   ? galcanezumab-gnlm (EMGALITY PEN) 120 mg/mL subcutaneous PEN Inject 1 mL under the skin every 30 days.   ? magnesium oxide 400 mg magnesium tab Take 1 tablet by mouth daily.   ? omeprazole DR (PRILOSEC) 40 mg capsule TAKE 1 CAPSULE BY MOUTH TWICE DAILY 30 MINUTES TO 1 HOUR PRIOR TO FIRST AND LAST MEAL OF THE DAY   ? other medication 1 Dose once. Medication Name & Strength: Tumeric    Dose(how many): tablet    Frequency(how often): Daily   ? propranoloL (INDERAL) 10 mg tablet Take one tablet by mouth twice daily.   ? riboflavin (vitamin B2) 400 mg tab Take one tablet by mouth daily.   ? rizatriptan (MAXALT) 10 mg tablet Take one tablet by mouth daily as needed. May repeat in 2 hours in needed   ? ubrogepant (UBRELVY) 50 mg tablet Take one tablet by mouth daily as needed. May repeat once after 2 hours based on response.     Vitals:    12/17/19 1050   BP: 102/67   Pulse: 89   Temp: 36.4 ?C (97.6 ?F)   Weight: 65.8 kg (145 lb)   Height: 165.1 cm (65)   PainSc: Four     Body mass index is 24.13 kg/m?Marland Kitchen     Physical Exam  Vitals reviewed.   Constitutional:       Appearance: Normal appearance.  HENT:      Head: Normocephalic and atraumatic.   Cardiovascular:      Rate and Rhythm: Normal rate and regular rhythm.   Musculoskeletal:         General: No swelling.   Neurological:      Mental Status: She is alert.           Neuro Exam:  Mental Status: A&Ox3  Speech: fluent without dysarthria  CN: visual fields full to confrontation, PERRLA, EOMI, strength of facial muscles is full and symmetric, hearing intact to conversation, symmetric palate elevation, tongue midline  Motor: normal tone/bulk, strength 5/5 throughout  Coordination: normal finger to nose bilaterally  Gait: normal    Concussion Symptom Checklist Score:  Concussion Symptom Checklist 12/17/2019 11/05/2019   Headache 4 3   Balance Problem 0 0   Dizziness 1 1   Fatigue 2 2   Trouble Falling Asleep 4 5   Sleeping more than usual 0 0   Sleeping less than usual 0 2   Drowsiness 5 2   Sensitivity To Light 1 4   Sensitivity To Noise 2 4   Irritability 2 2   Sadness 1 2   Nervousness 2 2   More Emotional 0 1   Numbness or tingling and/or neck pain 0 0   Feeling Slowed Down 3 2   Feeling Mentally Foggy 3 2   Difficulty Concentrating 2 2   Difficult Remembering 2 2   Visual Problems 0 0   Neck Pain 2 3   Total Symptoms Score 37 44     Total Symptoms Score: 37    Assessment and Plan:  1. Concussion without loss of consciousness, subsequent encounter     2. Cervicalgia       Penny Townsend is doing much better.  I think that her remaining symptoms are related to her pre-existing chronic migraines rather than persistent post-concussive symptoms.  She still has not gotten the prism lenses and does complain of eye pain still.  I don't know that this is neurological rather than ophthalmologic, but does seem to be temporally related to the work injury.  With regards to her concussion, I feel that she has reached MMI.    Final recommendations:  1. She will continue to f/u with her neurologist for treatment of her pre-existing chronic migraines  2. She does need to obtain the prism lenses as recommended by Dr. Homero Fellers and myself to see if this helps with occular symptoms.  If it does not, she would need further evaluation by an ophthalmologist as this is not a neurological issue.  3. I do not provide disability ratings, should one be needed she would need to be referred to another provider    Final work status: full duty, no restrictions    At this time I am discharging Penny Townsend from concussion clinic.

## 2020-01-02 ENCOUNTER — Encounter: Admit: 2020-01-02 | Discharge: 2020-01-02

## 2020-01-02 MED FILL — EMGALITY PEN 120 MG/ML SC PNIJ: 120 mg/mL | SUBCUTANEOUS | 30 days supply | Qty: 1 | Fill #3 | Status: AC

## 2020-01-28 ENCOUNTER — Encounter: Admit: 2020-01-28 | Discharge: 2020-01-28

## 2020-01-29 MED FILL — EMGALITY PEN 120 MG/ML SC PNIJ: 120 mg/mL | SUBCUTANEOUS | 30 days supply | Qty: 1 | Fill #4 | Status: AC

## 2020-02-14 ENCOUNTER — Encounter: Admit: 2020-02-14 | Discharge: 2020-02-14

## 2020-02-14 NOTE — Telephone Encounter
Corinna Lines, Nurse at [facility] [address] clinic in [place] calls requesting most recent Office visit with Dr. Arrie Aran be faxed to [phone]  Office note faxed

## 2020-02-17 ENCOUNTER — Encounter: Admit: 2020-02-17 | Discharge: 2020-02-17

## 2020-02-18 ENCOUNTER — Encounter: Admit: 2020-02-18 | Discharge: 2020-02-18

## 2020-02-18 NOTE — Telephone Encounter
-----   Message from [employer]. Ruddock sent at 02/17/2020  4:14 PM CDT -----  Regarding: Visit Follow-Up Question  Contact: [phone]  Hi Dr. Virgina Evener,  The prism glasses aren't helping me.  I continue to have bad headaches everyday and migraines 3-4 times a week.  It is very hard for me to do much of anything.  Herbert Seta

## 2020-02-18 NOTE — Telephone Encounter
The migraines were pre-existing to the injury and were managed by Dr. Arrie Aran.  She would need to touch base with him about continued migraine management.

## 2020-02-25 ENCOUNTER — Encounter: Admit: 2020-02-25 | Discharge: 2020-02-25

## 2020-02-26 ENCOUNTER — Encounter: Admit: 2020-02-26 | Discharge: 2020-02-26

## 2020-02-26 MED FILL — EMGALITY PEN 120 MG/ML SC PNIJ: 120 mg/mL | SUBCUTANEOUS | 30 days supply | Qty: 1 | Fill #5 | Status: AC

## 2020-03-10 ENCOUNTER — Encounter: Admit: 2020-03-10 | Discharge: 2020-03-10

## 2020-03-10 ENCOUNTER — Ambulatory Visit: Admit: 2020-03-10 | Discharge: 2020-03-11 | Payer: Commercial Managed Care - PPO

## 2020-03-10 DIAGNOSIS — G43719 Chronic migraine without aura, intractable, without status migrainosus: Secondary | ICD-10-CM

## 2020-03-10 DIAGNOSIS — G43909 Migraine, unspecified, not intractable, without status migrainosus: Secondary | ICD-10-CM

## 2020-03-10 DIAGNOSIS — R413 Other amnesia: Secondary | ICD-10-CM

## 2020-03-10 DIAGNOSIS — G479 Sleep disorder, unspecified: Secondary | ICD-10-CM

## 2020-03-10 DIAGNOSIS — O039 Complete or unspecified spontaneous abortion without complication: Secondary | ICD-10-CM

## 2020-03-10 DIAGNOSIS — R002 Palpitations: Secondary | ICD-10-CM

## 2020-03-10 DIAGNOSIS — J45909 Unspecified asthma, uncomplicated: Secondary | ICD-10-CM

## 2020-03-10 DIAGNOSIS — M1990 Unspecified osteoarthritis, unspecified site: Secondary | ICD-10-CM

## 2020-03-10 DIAGNOSIS — R079 Chest pain, unspecified: Secondary | ICD-10-CM

## 2020-03-10 DIAGNOSIS — I499 Cardiac arrhythmia, unspecified: Secondary | ICD-10-CM

## 2020-03-10 DIAGNOSIS — R1319 Other dysphagia: Secondary | ICD-10-CM

## 2020-03-10 NOTE — Progress Notes
Date of Service: 03/10/2020    Obtained patient's verbal consent to treat them and their agreement to [facility] financial policy and NPP via this telehealth visit (using Zoom) during the [facility]     Subjective:             Penny Townsend is a 54 y.o. female here for follow up due to headaches.     History of Present Illness  Penny Townsend is a 54 y.o. female with a past medical history of cervical spondylosis, asthma, recent concussion and OSA (on CPAP), here for follow up due to chronic headaches. Patient describes headaches for years, they are usually localized on frontal/occipital area, pain described as sharp/tightness sensation, headaches usually last for hours, they are associated with nausea, phonophobia and photophobia; denies aura or visual/autonomic symptoms with them, but has noticed more visual issues (including eye buzzing) since concussion. Stress, recent concussion and leaning forward may trigger headaches but she denies new triggers lately. Frequency has been 3- 4 episodes a week (better than before) and severity could be up to 9/10. Headaches are overall slightly better lately. Patient also reports occasional dizzy spells and neck pain (but denies radiation), no recent vertigo or falls reported. Brain imaging was unremarkable in the past. Patient denies recent sleep problems.      Headache medications:  Preventive:   Qulipta 10mg  qhs (possible benefit, denies side effects)  Emgality (some benefit reported in the past)  Propranolol 10mg  bid (denies clear benefit for headaches or SE)  Gabapentin 800mg  bid (reports benefit with higher dose, denies SE)  Amitriptyline 100mg  qhs (reports some benefit for sleep and with headaches and denies side effects other than dry mouth)   Magnesium glycinate 300mg  qday, Riboflavin 200mg  qday and B12 1mg  (reported benefit)  She tried Botox with some benefit (had injections for a year, headaches were less frequent for a few months, but last 2 sessions did not help).  Tried muscle relaxants, Duloxetine and Topiramate but denies benefit.       Abortive:   Tylenol ER with some benefit (denies taking lately), she takes Zofran with some benefit. She denies taking any other over-the-counter medication recently.   Rizatriptan (reports some benefit).   Dorris Singh (denies clear benefit)       Review of Systems   Neurological: Positive for headaches.     Medical History:   Diagnosis Date   ? Arthritis    ? Asthma    ? Cardiac dysrhythmia    ? Chest pain    ? Heart palpitations    ? Memory loss    ? Migraines    ? Miscarriage     x2   ? Other dysphagia    ? Sleep disorder        Surgical History:   Procedure Laterality Date   ? THORACENTESIS N/A 04/15/2016    Performed by Nell Range, MD at [facility]   ? BREATH HYDROGEN/ methane testing to rule out bacterial overgrowth N/A 05/24/2017    Performed by Normajean Baxter, MD at [facility]   ? CESAREAN SECTION     ? ROTATOR CUFF REPAIR         Social History     Socioeconomic History   ? Marital status: Married     Spouse name: Not on file   ? Number of children: Not on file   ? Years of education: Not on file   ? Highest education level: Not on file  Occupational History   ? Occupation: educator   Tobacco Use   ? Smoking status: Never Smoker   ? Smokeless tobacco: Never Used   Substance and Sexual Activity   ? Alcohol use: Yes     Comment: RARELY   ? Drug use: No   ? Sexual activity: Yes     Partners: Male     Birth control/protection: Post-menopausal   Other Topics Concern   ? Not on file   Social History Narrative    Lives with husband and 2 kids (adopted 2 kids). Has 1 biologic daughter. No DV.  Works at [employer].  Volunteers at [employer]       Family History   Problem Relation Age of Onset   ? Heart problem Father    ? Cancer Father         liver   ? Heart Attack Father    ? Thyroid Disease Father    ? Diabetes Father    ? GI Cancer Father         liver CA   ? Cancer Mother pancreatic   ? Thyroid Disease Mother    ? Diabetes Mother    ? GI Cancer Mother         pancreatic CA   ? Colon Polyps Mother    ? Other Sister         GI issues   ? Other Brother         GI issues   ? Melanoma Brother    ? Other Brother         GI issues   ? Celiac Disease Neg Hx    ? Cancer-Colon Neg Hx    ? Inflammatory Bowel Disease Neg Hx    ? Rectal Cancer Neg Hx    ? Ulcerative Colitis Neg Hx        ALLERGIES  Allergies   Allergen Reactions   ? Morphine DELUSIONS   ? Other [Unclassified Drug] SWOLLEN TONGUE     Ground beef   ? Oxycodone DELUSIONS   ? Gluten STOMACH UPSET   ? Mango SEE COMMENTS     Swollen lip     Objective:         ? acetaminophen SR (TYLENOL) 650 mg tablet Take 650 mg by mouth every 6 hours as needed for Pain.   ? amitriptyline (ELAVIL) 100 mg tablet TAKE 1&1/2 TABS BY MOUTH EVERY NIGHT AT BEDTIME   ? ergocalciferol (vitamin D2) (VITAMIN D PO) Take  by mouth daily.   ? fexofenadine(+) (ALLEGRA) 180 mg tablet Take 180 mg by mouth daily.   ? gabapentin (NEURONTIN) 800 mg tablet Take one tablet by mouth every 8 hours.   ? galcanezumab-gnlm (EMGALITY PEN) 120 mg/mL subcutaneous PEN Inject 1 mL under the skin every 30 days.   ? magnesium oxide 400 mg magnesium tab Take 1 tablet by mouth daily.   ? omeprazole DR (PRILOSEC) 40 mg capsule TAKE 1 CAPSULE BY MOUTH TWICE DAILY 30 MINUTES TO 1 HOUR PRIOR TO FIRST AND LAST MEAL OF THE DAY   ? other medication 1 Dose once. Medication Name & Strength: Tumeric    Dose(how many): tablet    Frequency(how often): Daily   ? propranoloL (INDERAL) 10 mg tablet Take one tablet by mouth twice daily.   ? QULIPTA 10 mg tablet Take 10 mg by mouth daily.   ? riboflavin (vitamin B2) 400 mg tab Take one tablet by mouth daily.   ?  rizatriptan (MAXALT) 10 mg tablet Take one tablet by mouth daily as needed. May repeat in 2 hours in needed   ? ubrogepant (UBRELVY) 50 mg tablet Take one tablet by mouth daily as needed. May repeat once after 2 hours based on response. There were no vitals filed for this visit.  There is no height or weight on file to calculate BMI.     Physical Exam     GENERAL APPEARANCE: The patient is alert. Patient is in no acute distress, following commands and cooperative.      NEUROLOGIC EXAM:   Orientation: The patient is alert and oriented times three. Patient is following commands. Speech is fluent, intact comprehension, repetition and naming.     Cranial nerves:  2-12th cranial nerves: No gross abnormalities, face is symmetric.    Patient is able to move both upper and lower extremities spontaneously during the interview.    LABS:  Sodium   Date Value Ref Range Status   09/20/2019 141 137 - 147 MMOL/L Final     Potassium   Date Value Ref Range Status   09/20/2019 4.1 3.5 - 5.1 MMOL/L Final     Chloride   Date Value Ref Range Status   09/20/2019 102 98 - 110 MMOL/L Final     CO2   Date Value Ref Range Status   09/20/2019 31 (H) 21 - 30 MMOL/L Final     Anion Gap   Date Value Ref Range Status   09/20/2019 8 3 - 12 Final     Blood Urea Nitrogen   Date Value Ref Range Status   09/20/2019 17 7 - 25 MG/DL Final     Creatinine   Date Value Ref Range Status   09/20/2019 0.88 0.4 - 1.00 MG/DL Final     Glucose   Date Value Ref Range Status   09/20/2019 98 70 - 100 MG/DL Final     Calcium   Date Value Ref Range Status   09/20/2019 9.4 8.5 - 10.6 MG/DL Final     AST (SGOT)   Date Value Ref Range Status   09/20/2019 23 7 - 40 U/L Final     ALT (SGPT)   Date Value Ref Range Status   09/20/2019 26 7 - 56 U/L Final     Alk Phosphatase   Date Value Ref Range Status   09/20/2019 107 25 - 110 U/L Final     Albumin   Date Value Ref Range Status   09/20/2019 4.2 3.5 - 5.0 G/DL Final     Total Bilirubin   Date Value Ref Range Status   09/20/2019 0.3 0.3 - 1.2 MG/DL Final     Creatine Kinase   Date Value Ref Range Status   04/14/2016 23 21 - 215 U/L Final     Cholesterol   Date Value Ref Range Status   03/20/2018 199 <200 MG/DL Final     Triglycerides   Date Value Ref Range Status   03/20/2018 63 <150 MG/DL Final     HDL   Date Value Ref Range Status   03/20/2018 64 >40 MG/DL Final     LDL   Date Value Ref Range Status   03/20/2018 115 (H) <100 mg/dL Final     VLDL   Date Value Ref Range Status   03/20/2018 13 MG/DL Final     TSH   Date Value Ref Range Status   03/20/2018 1.10 0.35 - 5.00 MCU/ML Final      Assessment  and Plan:  Chronic migraines. Patient thinks that her headaches have been better in the last few months. She reports possible benefit with recent Qulipta and Rizatriptan, patient reports good tolerance. She denies new neurological symptoms and limited exam was stable today. We will try similar medications for now since patient recently started [place].     Recommendations:  1. We will keep Qulipta 10mg  qday for headache prophylaxis. The patient was advised about common side effects with this medication use. We may titrate this medication up in the next few months if needed.   2. We will also use Rizatriptan as abortive medications for headaches. Patient was instructed to decrease/limit amount of NSAIDs with time as they may contribute to rebound headaches.   3. Follow-up appointment in 4-6 months.     Total time was 40 minutes. This time was spent preparing to see the patient, obtaining and/or reviewing history, performing an examination, ordering medications, tests/ procedures, communicating results to the patient, documenting clinical information in the electronic health record and counseling/educating the patient regarding headaches.

## 2020-03-24 ENCOUNTER — Encounter: Admit: 2020-03-24 | Discharge: 2020-03-24

## 2020-03-27 ENCOUNTER — Encounter: Admit: 2020-03-27 | Discharge: 2020-03-27

## 2020-03-27 MED FILL — EMGALITY PEN 120 MG/ML SC PNIJ: 120 mg/mL | SUBCUTANEOUS | 30 days supply | Qty: 1 | Fill #6 | Status: AC

## 2020-04-01 ENCOUNTER — Encounter: Admit: 2020-04-01 | Discharge: 2020-04-01

## 2020-04-01 MED ORDER — GABAPENTIN 800 MG PO TAB
800 mg | ORAL_TABLET | ORAL | 5 refills | Status: AC
Start: 2020-04-01 — End: ?

## 2020-04-17 ENCOUNTER — Encounter: Admit: 2020-04-17 | Discharge: 2020-04-17

## 2020-04-17 MED ORDER — EMGALITY PEN 120 MG/ML SC PNIJ
120 mg | SUBCUTANEOUS | 5 refills
Start: 2020-04-17 — End: ?

## 2020-04-22 ENCOUNTER — Encounter: Admit: 2020-04-22 | Discharge: 2020-04-22

## 2020-04-24 ENCOUNTER — Encounter: Admit: 2020-04-24 | Discharge: 2020-04-24

## 2020-04-24 MED FILL — EMGALITY PEN 120 MG/ML SC PNIJ: 120 mg/mL | SUBCUTANEOUS | 30 days supply | Qty: 1 | Fill #1 | Status: AC

## 2020-05-14 ENCOUNTER — Encounter: Admit: 2020-05-14 | Discharge: 2020-05-14

## 2020-05-14 NOTE — Progress Notes
Pharmacy Medication Re-assessment    Indication/Regimen  The regimen of EMGALITY PEN 120 MG/ML SC PNIJ indefinitely is appropriate for [employer] Mudrick who has Migraine without aura and without status migrainosus, not intractable.    Renal dose adjustments are not required. Hepatic dose adjustments are not required. Dose titration is not required.    The patient has the ability to self-administer the medication(s).    Therapeutic Goals and Monitoring  The goal of therapy is to reduce monthly migraine days and monthly acute migraine-specific medication days.    Activities of Daily Living  Days with missed social or leisure activities in the past 30 days: 8  Days unable to complete house/yard work in the past 30 days: 8    Headache Control  Number of headache days: 8  Duration: 30 days  Severe headache symptoms: yes  Monthly migraine days: 8  Monthly acute migraine-specific medication days: 8    The patient is making progress toward achieving their therapeutic goal. The plan is to continue current therapy.    Allergies  Allergies   Allergen Reactions   ? Morphine DELUSIONS   ? Other [Unclassified Drug] SWOLLEN TONGUE     Ground beef   ? Oxycodone DELUSIONS   ? Gluten STOMACH UPSET   ? Mango SEE COMMENTS     Swollen lip      Immunizations  Vaccine history was reviewed with the patient. Education was provided on the importance of completing vaccines, including annual influenza vaccine and COVID-19 vaccine.    Immunization History   Administered Date(s) Administered   ? Flu Vaccine =>65 YO High-Dose (PF) 04/10/2016   ? Pneumococcal Vaccine (23-Val Adult) 04/10/2016     Medication Reconciliation  Medication history and reconciliation were performed (including prescription medications, supplements, over the counter, and herbal products). The medication list was updated and the patient's current medication list is included below.     Home Medications    Medication Sig   acetaminophen SR (TYLENOL) 650 mg tablet Take 650 mg by mouth every 6 hours as needed for Pain.   amitriptyline (ELAVIL) 100 mg tablet TAKE 1&1/2 TABS BY MOUTH EVERY NIGHT AT BEDTIME   ergocalciferol (vitamin D2) (VITAMIN D PO) Take  by mouth daily.   fexofenadine(+) (ALLEGRA) 180 mg tablet Take 180 mg by mouth daily.   gabapentin (NEURONTIN) 800 mg tablet Take one tablet by mouth every 8 hours.   galcanezumab-gnlm (EMGALITY PEN) 120 mg/mL subcutaneous PEN Inject 1 mL under the skin every 30 days.   magnesium oxide 400 mg magnesium tab Take 1 tablet by mouth daily.   omeprazole DR (PRILOSEC) 40 mg capsule TAKE 1 CAPSULE BY MOUTH TWICE DAILY 30 MINUTES TO 1 HOUR PRIOR TO FIRST AND LAST MEAL OF THE DAY   other medication 1 Dose once. Medication Name & Strength: Tumeric    Dose(how many): tablet    Frequency(how often): Daily   propranoloL (INDERAL) 10 mg tablet Take one tablet by mouth twice daily.   QULIPTA 10 mg tablet Take 10 mg by mouth daily.   riboflavin (vitamin B2) 400 mg tab Take one tablet by mouth daily.   rizatriptan (MAXALT) 10 mg tablet Take one tablet by mouth daily as needed. May repeat in 2 hours in needed   ubrogepant (UBRELVY) 50 mg tablet Take one tablet by mouth daily as needed. May repeat once after 2 hours based on response.     The patient was reminded to speak with their health care provider before starting  any new drug, including prescription or over the counter, natural / herbal products, or vitamins.    Drug Interactions    Drug-Drug Interactions  Drug-drug interactions were evaluated. There were not clinically significant drug-drug interactions.     Drug-Food Interactions  Drug-food interactions were evaluated. There are not clinically significant drug-food interactions. should be taken NA - not oral.    Adverse Drug Events  Adverse drug events were reviewed with the patient.    The patient does not have injection related concerns.    The patient does not have infection related concerns.    Significant adverse drug event(s) were not identified.    Adherence  Refill and adherence history were reviewed with the patient. The patient was educated on the importance of adherence.    Patient is adherent with refills: yes  Patient is meeting refill adherence goal: yes    Patient reported 0 missed doses over the past 30 days.   Patient is meeting reported adherence goal.    Safety Precautions    Risk Evaluation and Mitigation (REMS) Assessment: REMS is not required for this medication.    Safety precautions were addressed and discussed with the patient as applicable.    Contraindications: Suszanne Conners Halle does not have contraindications to this medication.      Pregnancy Status: Female, not of child-bearing potential, education not applicable.    Follow-up Plan    Kerie Badger Predmore was given the opportunity to ask questions and did not have any questions at the time. Patient was reminded of the refill process and encouraged to call with questions.    The patient will be reassessed within 1 year.    The medication(s) will be shipped from [facility] pharmacy.    Prentiss Bells, PHARMD

## 2020-05-19 ENCOUNTER — Encounter: Admit: 2020-05-19 | Discharge: 2020-05-19

## 2020-05-19 NOTE — Progress Notes
Spoke with  Penny Townsend to discuss funding options, she is currently without pharmacy insurance regarding their medication: emgality.  Patient expressed understanding and agreed to the following next steps: to email her proof of yearly gross income. Please email [email or web address]. Will folllow up in .2 days to proceed with the following applications process     Gaston Islam, CPhT  Pharmacy Patient Advocate, Specialty Pharmacy  [phone]

## 2020-05-21 ENCOUNTER — Encounter: Admit: 2020-05-21 | Discharge: 2020-05-21

## 2020-05-22 ENCOUNTER — Encounter: Admit: 2020-05-22 | Discharge: 2020-05-22

## 2020-05-22 NOTE — Progress Notes
The specialty pharmacy is currently waiting on the following information for Penny Townsend's specialty medication, Emgality:    Patient and Providers signature prior to being submitted to [facility] application    The specialty pharmacy team has requested this information and until it is received the specialty pharmacy will not be able to continue the process for specialty medication access.    [facility]  Pharmacy Patient Advocate  [phone]

## 2020-05-27 ENCOUNTER — Encounter: Admit: 2020-05-27 | Discharge: 2020-05-27

## 2020-05-27 NOTE — Progress Notes
The specialty pharmacy is currently waiting on the following information for Penny Townsend's specialty medication, Emgality:    Provider's signature    The specialty pharmacy team has requested this information and until it is received the specialty pharmacy will not be able to continue the process for specialty medication access.    Once all of the documents have been received the application will be submitted. Patient is currently without Probation officer Patient Advocate  [phone]

## 2020-05-29 ENCOUNTER — Encounter: Admit: 2020-05-29 | Discharge: 2020-05-29

## 2020-05-29 NOTE — Progress Notes
The specialty pharmacy is currently waiting on the following information for Penny Townsend's specialty medication, Emgality:    Response from manufacturer    The specialty pharmacy team has requested this information and until it is received the specialty pharmacy will not be able to continue the process for specialty medication access.    [facility]  Pharmacy Patient Advocate  [phone]

## 2020-05-31 ENCOUNTER — Encounter: Admit: 2020-05-31 | Discharge: 2020-05-31

## 2020-05-31 NOTE — Progress Notes
Contacted Penny Townsend to discuss her manufacturer assistance regarding their medication: emgality.As of 2022, the income guidelines have increased, after re-reviewing her 1040 for a household size of 4 she is still over income for [employer]. I have contact [employer] by phone and they are currently closed Saturday,Febrary 5.2022.   Left voicemail asking patient to return call to the specialty pharmacy [phone]).  Will call patient again in 1 business days if no call back received.Gaston Islam, CPhT  Pharmacy Patient Advocate, Specialty Pharmacy  [phone]

## 2020-06-02 ENCOUNTER — Encounter: Admit: 2020-06-02 | Discharge: 2020-06-02

## 2020-06-02 NOTE — Progress Notes
Contacted Penny Townsend to inform her after speaking with [employer]The application was resubmitted, I contacted Penny Rainwater, RN to her provider for a possible office sample      Gaston Islam, CPhT  Pharmacy Patient Advocate, Specialty Pharmacy  [phone]

## 2020-06-05 NOTE — Progress Notes
Submitted additional documents that were not received in the original submission for review-Income documents. Will contact [employer] in 1 business day.

## 2020-06-06 NOTE — Progress Notes
Contacted [phone] [employer]  regarding their medication: Emgality. The representative stated the review process is taking additional time. I will call on 2.15.2022  Gaston Islam, CPhT  Pharmacy Patient Advocate, Specialty Pharmacy  [phone]

## 2020-06-10 NOTE — Progress Notes
The medication assistance application for Emgality has been approved for Alexsa Flaum [place] from 2.14.2022 to 2.14.2023.  The patient will receive the medication directly from the drug company during the approval period.    [employer] Medication Restaurant manager, fast food Information: [place], [phone] // [email or web address]    [facility]  Pharmacy Patient Advocate  [phone]

## 2020-06-17 ENCOUNTER — Encounter: Admit: 2020-06-17 | Discharge: 2020-06-17

## 2020-06-18 ENCOUNTER — Encounter: Admit: 2020-06-18 | Discharge: 2020-06-18

## 2020-12-10 ENCOUNTER — Encounter: Admit: 2020-12-10 | Discharge: 2020-12-10

## 2020-12-10 NOTE — Telephone Encounter
Received call from pt stating she feels like she is at a stand still. She is still having eye issues that she described as eye bouncing.  She had previously seen Dr. Homero Fellers and was given prism glasses and then later some glasses that she uses while on a computer. She mentioned that Dr. Homero Fellers had given her a few names to do vision therapy with, but it was never followed through on. She has concerns with the distance she would have to drive to do vision therapy and would like to know if this can ordered and somewhere close to her home.  Please advise?      Of note, she was d/c'd from the concussion clinic at [facility] on 12/17/19 with mention of, "She does need to obtain the prism lenses as recommended by Dr. Homero Fellers and myself to see if this helps with occular symptoms. If it does not, she would need further evaluation by an ophthalmologist as this is not a neurological issue."

## 2020-12-10 NOTE — Telephone Encounter
So she has been released with regards to the work comp.  There is no one closer for vision therapy--they are all located on the [facility] side of [address].  It would not be covered under work comp at this point either and insurance does not often cover it.  I'm not sure that there is much more to be done.

## 2021-04-08 ENCOUNTER — Encounter: Admit: 2021-04-08 | Discharge: 2021-04-08

## 2021-04-08 NOTE — Progress Notes
Patient is no longer following with [facility] providers. Thus, the patient will be removed from the specialty pharmacy patient management program.

## 2021-05-08 ENCOUNTER — Encounter: Admit: 2021-05-08 | Discharge: 2021-05-08

## 2021-05-08 ENCOUNTER — Ambulatory Visit: Admit: 2021-05-08 | Discharge: 2021-05-08 | Payer: Worker's Compensation

## 2021-05-08 DIAGNOSIS — M1990 Unspecified osteoarthritis, unspecified site: Secondary | ICD-10-CM

## 2021-05-08 DIAGNOSIS — R002 Palpitations: Secondary | ICD-10-CM

## 2021-05-08 DIAGNOSIS — H5052 Exophoria: Secondary | ICD-10-CM

## 2021-05-08 DIAGNOSIS — O039 Complete or unspecified spontaneous abortion without complication: Secondary | ICD-10-CM

## 2021-05-08 DIAGNOSIS — I499 Cardiac arrhythmia, unspecified: Secondary | ICD-10-CM

## 2021-05-08 DIAGNOSIS — G479 Sleep disorder, unspecified: Secondary | ICD-10-CM

## 2021-05-08 DIAGNOSIS — G43009 Migraine without aura, not intractable, without status migrainosus: Secondary | ICD-10-CM

## 2021-05-08 DIAGNOSIS — G43909 Migraine, unspecified, not intractable, without status migrainosus: Secondary | ICD-10-CM

## 2021-05-08 DIAGNOSIS — R1319 Other dysphagia: Secondary | ICD-10-CM

## 2021-05-08 DIAGNOSIS — H4922 Sixth [abducent] nerve palsy, left eye: Secondary | ICD-10-CM

## 2021-05-08 DIAGNOSIS — J45909 Unspecified asthma, uncomplicated: Secondary | ICD-10-CM

## 2021-05-08 DIAGNOSIS — R079 Chest pain, unspecified: Secondary | ICD-10-CM

## 2021-05-08 DIAGNOSIS — R413 Other amnesia: Secondary | ICD-10-CM

## 2021-05-08 DIAGNOSIS — H04123 Dry eye syndrome of bilateral lacrimal glands: Secondary | ICD-10-CM

## 2021-05-08 NOTE — Progress Notes
Body mass index is 23.3 kg/m.      VISUAL FIELD, EXTEND        [facility] Automated exam type was used.     Right Eye  Threshold was 24-2. Strategy was SITA Standard. Normal     Left Eye  Threshold was 24-2. Strategy was SITA Standard. Normal               Assessment and Plan:  This pleasant lady with history of concussion and headaches followed by Drs. Rippee and Kawano-Castillo respectively is here today for our assistance with vision problems thought to be related to her concussion.  She had seen Dr. Caryl Pina Reddell for these problems and had been fitted for prism glasses.  "Vision therapy" had been recommended but the patient could find no practitioners near her.  This visit was then arranged.  Problem   Dry Eye Syndrome of Both Eyes   Exophoria   6th Nerve Palsy, Left          The patient's exam today reveals good acuity of 20/20 in both eyes with normal color vision and normal visual fields OU.  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  At near she has a 6 pd exotropia--larger than her distance deviation but not enough to be classified as a convergence insufficiency exotropia.  We would not typically suggest prismatic correction for this small amount, and the patient did not bring her prism readers so we cannot comment on her need.  If she finds it useful, by all means she should continue it.    She has bilateral dry eyes, the cause of her intermittent lancinating eye pain and "buzzing" sensation/blurred vision.  With her subjective dry mouth, this raises the possibility of Sjogren's syndrome so we suggest serologic testing for that.  For treatment we suggest use of non-preserved artificial tears at least 3-4 times daily with occasional use of AT gel as needed.      Her cataracts are not visually significant, and can simply be monitored.

## 2021-06-10 ENCOUNTER — Encounter: Admit: 2021-06-10 | Discharge: 2021-06-10

## 2021-07-05 IMAGING — CR CHEST
3 series · 3 of 3 positions shown · non-contrast
Comparison: none

[shoulder grashey]
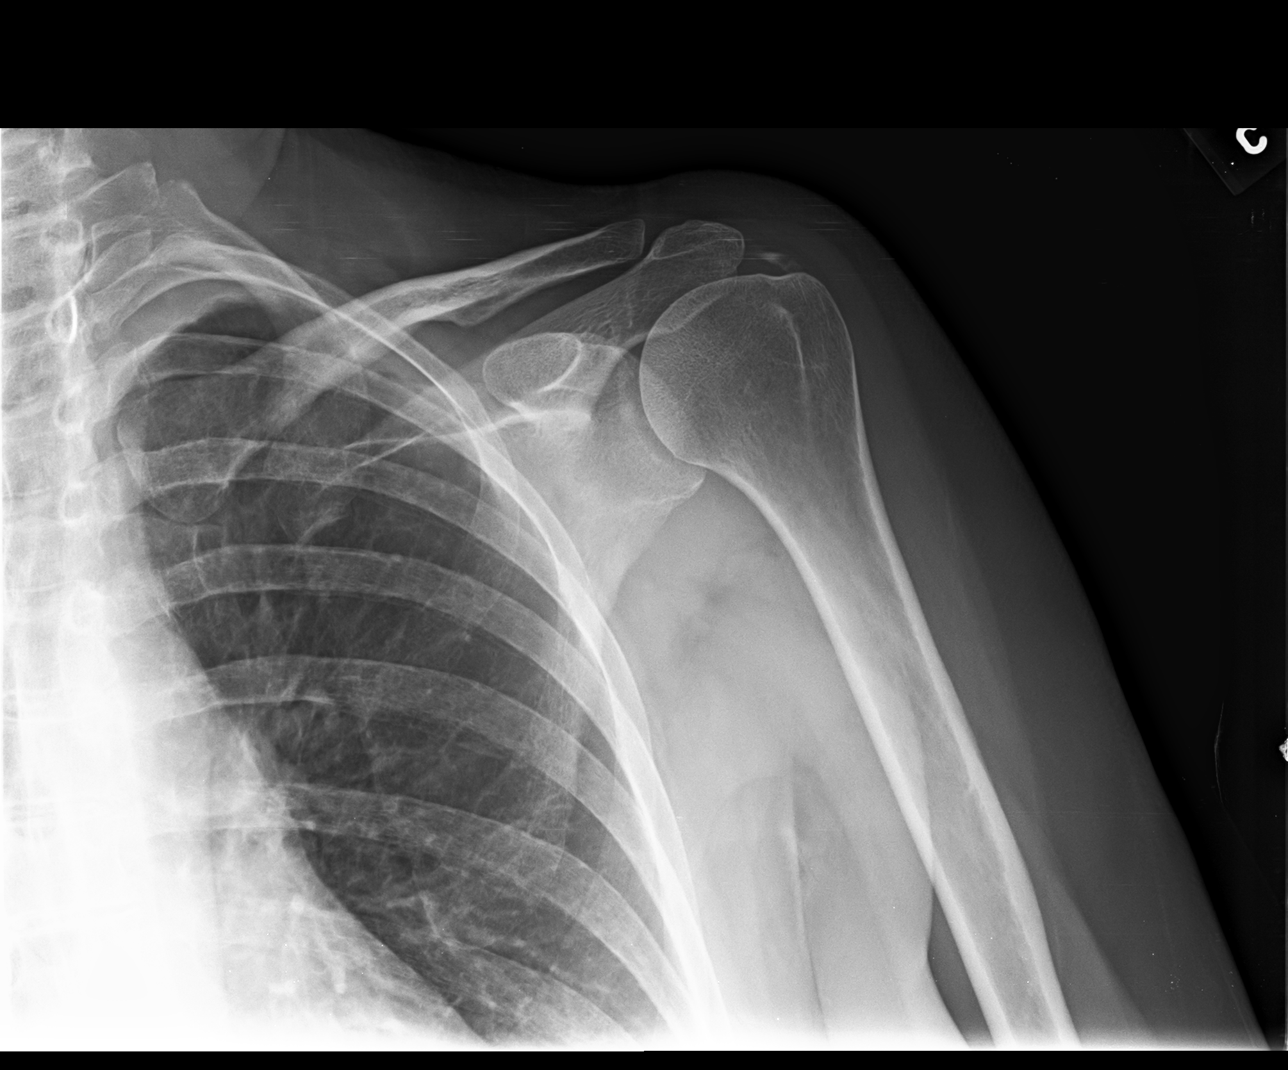

[shoulder y-view]
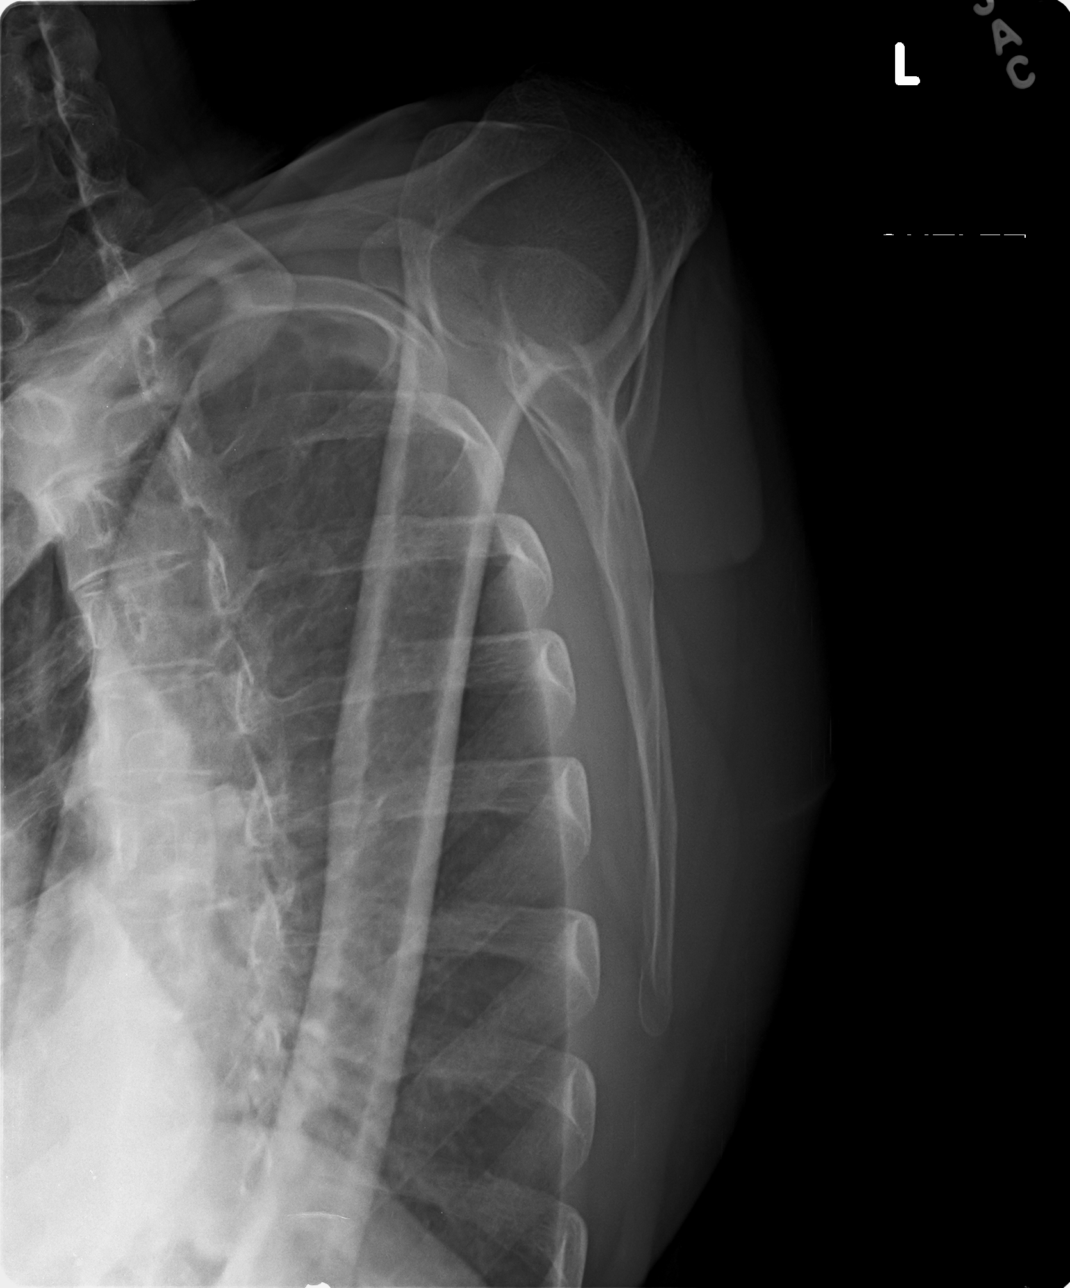

[shoulder axillary]
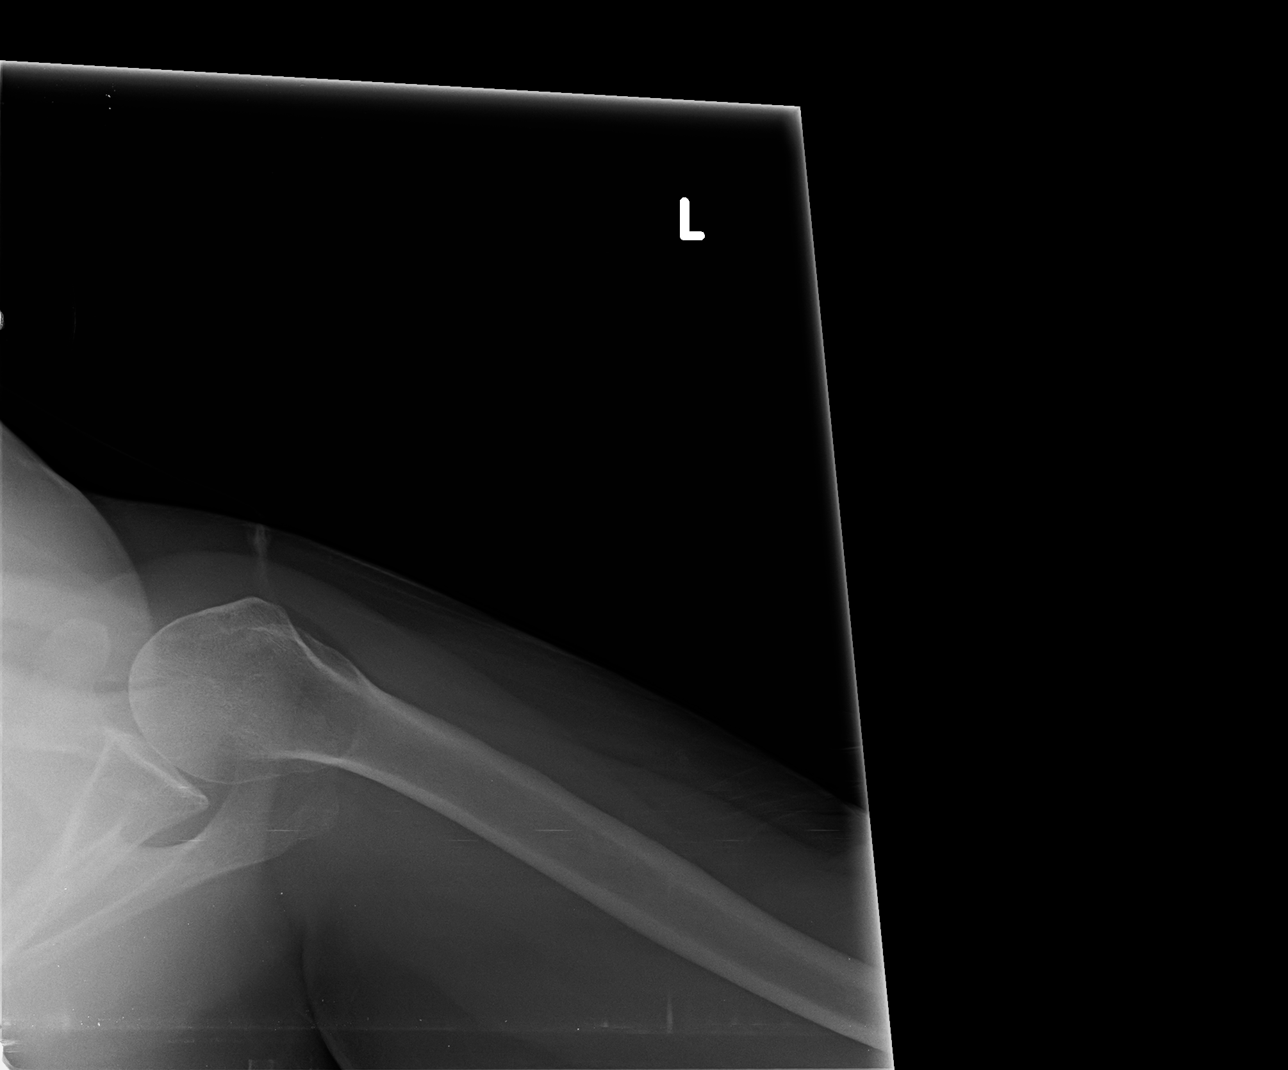

[3 of 3 positions shown; findings below may reference images not displayed]

DIAGNOSTIC STUDIES

EXAM

Left shoulder radiographs.

INDICATION

shoulder pain
Pt c/o LT shoulder pain radiating into humerus x 2 weeks. Limited ROM. Injury from large dog
pushing shoulder. Denied pregnancy. Shielded.JC/TB

TECHNIQUE

3 views of the left shoulder.

COMPARISONS

None.

FINDINGS

Anatomic alignment without fracture or dislocation. Soft tissue calcification at the greater
tuberosity, suspicious for calcific tendonitis. Mild acromioclavicular joint degeneration.

IMPRESSION

Calcific tendonitis is suggested. No acute osseous abnormality.

Tech Notes:

Pt c/o LT shoulder pain radiating into humerus x 2 weeks. Limited ROM. Injury from large dog pushing
shoulder. Denied pregnancy. Shielded.JC/TB

## 2021-07-22 ENCOUNTER — Encounter: Admit: 2021-07-22 | Discharge: 2021-07-22

## 2021-07-22 MED ORDER — EMGALITY PEN 120 MG/ML SC PNIJ
120 mg | SUBCUTANEOUS | 5 refills
Start: 2021-07-22 — End: ?

## 2021-07-23 ENCOUNTER — Encounter: Admit: 2021-07-23 | Discharge: 2021-07-23

## 2021-08-30 ENCOUNTER — Encounter: Admit: 2021-08-30 | Discharge: 2021-08-30

## 2022-01-18 ENCOUNTER — Encounter: Admit: 2022-01-18 | Discharge: 2022-01-18

## 2022-02-04 ENCOUNTER — Encounter: Admit: 2022-02-04 | Discharge: 2022-02-04

## 2022-02-04 ENCOUNTER — Ambulatory Visit: Admit: 2022-02-04 | Discharge: 2022-02-04 | Payer: Worker's Compensation

## 2022-02-04 DIAGNOSIS — R079 Chest pain, unspecified: Secondary | ICD-10-CM

## 2022-02-04 DIAGNOSIS — H5052 Exophoria: Secondary | ICD-10-CM

## 2022-02-04 DIAGNOSIS — G43909 Migraine, unspecified, not intractable, without status migrainosus: Secondary | ICD-10-CM

## 2022-02-04 DIAGNOSIS — R413 Other amnesia: Secondary | ICD-10-CM

## 2022-02-04 DIAGNOSIS — R1319 Other dysphagia: Secondary | ICD-10-CM

## 2022-02-04 DIAGNOSIS — M1990 Unspecified osteoarthritis, unspecified site: Secondary | ICD-10-CM

## 2022-02-04 DIAGNOSIS — J45909 Unspecified asthma, uncomplicated: Secondary | ICD-10-CM

## 2022-02-04 DIAGNOSIS — G479 Sleep disorder, unspecified: Secondary | ICD-10-CM

## 2022-02-04 DIAGNOSIS — R002 Palpitations: Secondary | ICD-10-CM

## 2022-02-04 DIAGNOSIS — O039 Complete or unspecified spontaneous abortion without complication: Secondary | ICD-10-CM

## 2022-02-04 DIAGNOSIS — H04123 Dry eye syndrome of bilateral lacrimal glands: Secondary | ICD-10-CM

## 2022-02-04 DIAGNOSIS — I499 Cardiac arrhythmia, unspecified: Secondary | ICD-10-CM

## 2022-02-04 NOTE — Progress Notes
There is no height or weight on file to calculate BMI.             Assessment and Plan:  05/08/21  This pleasant lady with history of concussion and headaches followed by Drs. Rippee and Kawano-Castillo respectively is here today for our assistance with vision problems thought to be related to her concussion.  She had seen Dr. Morrie Sheldon Reddell for these problems and had been fitted for prism glasses.  Vision therapy had been recommended but the patient could find no practitioners near her.  This visit was then arranged.  Problem   Dry Eye Syndrome of Both Eyes   Exophoria   6th Nerve Palsy, Left   ?  ?  The patient's exam today reveals good acuity of 20/20 in both eyes with normal color vision and normal visual fields OU.  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  At near she has a 6 pd exotropia--larger than her distance deviation but not enough to be classified as a convergence insufficiency exotropia.  We would not typically suggest prismatic correction for this small amount, and the patient did not bring her prism readers so we cannot comment on her need.  If she finds it useful, by all means she should continue it.  ?  She has bilateral dry eyes, the cause of her intermittent lancinating eye pain and buzzing sensation/blurred vision.  With her subjective dry mouth, this raises the possibility of Sjogren's syndrome so we suggest serologic testing for that.  For treatment we suggest use of non-preserved artificial tears at least 3-4 times daily with occasional use of AT gel as needed.    ?  Her cataracts are not visually significant, and can simply be monitored.    ADDENDUM:  We note past testing for SSA/SSB in 2017 was normal.      02/04/22 visit  Patient reports 4 week history of severe right eye pain, fluctuating throughout the day.  Topical anesthetic OD today did not significantly change the right eye pain, while Nurtec does, suggesting this pain is not ophthalmic.  We also found no abnormality on her exam today to explain her report of pain.     We suggest f/up with her neurology team for control of her headache.

## 2022-09-07 ENCOUNTER — Encounter: Admit: 2022-09-07 | Discharge: 2022-09-07 | Payer: BC Managed Care – PPO

## 2022-09-07 ENCOUNTER — Ambulatory Visit: Admit: 2022-09-07 | Discharge: 2022-09-07 | Payer: BC Managed Care – PPO

## 2022-09-07 DIAGNOSIS — G43909 Migraine, unspecified, not intractable, without status migrainosus: Secondary | ICD-10-CM

## 2022-09-07 DIAGNOSIS — M1990 Unspecified osteoarthritis, unspecified site: Secondary | ICD-10-CM

## 2022-09-07 DIAGNOSIS — R002 Palpitations: Secondary | ICD-10-CM

## 2022-09-07 DIAGNOSIS — I499 Cardiac arrhythmia, unspecified: Secondary | ICD-10-CM

## 2022-09-07 DIAGNOSIS — G43711 Chronic migraine without aura, intractable, with status migrainosus: Secondary | ICD-10-CM

## 2022-09-07 DIAGNOSIS — R079 Chest pain, unspecified: Secondary | ICD-10-CM

## 2022-09-07 DIAGNOSIS — J45909 Unspecified asthma, uncomplicated: Secondary | ICD-10-CM

## 2022-09-07 DIAGNOSIS — O039 Complete or unspecified spontaneous abortion without complication: Secondary | ICD-10-CM

## 2022-09-07 DIAGNOSIS — G479 Sleep disorder, unspecified: Secondary | ICD-10-CM

## 2022-09-07 DIAGNOSIS — R413 Other amnesia: Secondary | ICD-10-CM

## 2022-09-07 DIAGNOSIS — R1319 Other dysphagia: Secondary | ICD-10-CM

## 2022-09-07 MED ORDER — RIZATRIPTAN 10 MG PO TBDI
ORAL_TABLET | 11 refills | Status: AC
Start: 2022-09-07 — End: ?
  Filled 2022-09-11: qty 8, 20d supply, fill #1
  Filled 2022-09-11: qty 4, 10d supply, fill #2

## 2022-09-07 NOTE — Progress Notes
Date of Service: 09/07/2022    Subjective:             Penny Townsend is a 57 y.o. female in for evaluation regarding history of headaches.    History of Present Illness  Penny Townsend is a very pleasant 57 year old right-hand-dominant female who is in for neurologic evaluation and consultation regarding history of headaches.  She has been a headache suffer even as far back as her high school years but states she really began having trouble in 1997.  At that time, she was working on her masters in physical therapy and was trying to lift a patient and ripped her supraspinatus muscle and had to have surgery and after that had some nerve injury to the neck.  She states that since 1997 she has been having very frequent headaches and over the last multiple years she has had headaches on a daily basis.  She states that she has headaches that typically start in the occipital area of her head and will radiate to the crown of the head with a stabbing sharp pain with pressure and pounding associated with dizziness nausea and light and sound sensitivity.  She states that on a daily basis she has a moderate level headache that starts as a 4 when she wakes up but by the end of the day is a 6-7/10 on a 0-10 pain scale.  She states that at least twice a month she will have a severe headache that is a 9-10/10 on a 0-10 pain scale and states it feels as if her eyes are vibrating and her brain is vibrating and she has severe nausea and vomiting.  She states that when the more severe headaches hit she will feel increased pressure in her temples and increasing nausea and knows that she is going to have a bad headache.  She states that the severe headaches will be bad first 2 days and then she has a headache after mouth for 2 days prior to going back to her normal daily headache.  She has been on multiple medications and is currently still taking amitriptyline 150 mg nightly which she has been on for more than 10 years.  She states that she thinks it is helping her.  She has tried sumatriptan as an abortive medicine without benefit.  She has been on Topamax in the past without benefit.  She is continue to take gabapentin 800 mg twice daily which may give some benefit in the overall pain severity on the day-to-day basis.  She states she has tried taking gabapentin at higher dosing but has dizziness with the medication at higher doses.  She has tried Aimovig which caused constipation but was helpful, she tried [employer] which did not seem to give her much benefit she has used diclofenac as an abortive agent in the recent past and states that she has been on rizatriptan oral tablets which do help her but she has been unable to get them since she has been on Nurtec.  She is currently using Nurtec 75 mg every other day as a preventative therapy and does not have an abortive medication.  She had prior to that been on Qulipta 10 mg daily and states that she did not feel it was helping her.  She does note that bright lights/for out flashing lights loud noises physical exertion and massaging around her head and neck can increase her headaches.  She does have to lie down when the headaches are more severe.  She does  state that she did a years worth of Botox injections initially felt it was helpful but then the benefit seem to wear off but this was well over 10 years ago and she has never tried it in combination with the CGRP blockers.  She does have a family history of migraines in her mother and maternal grandmother.  She has been continued on propranolol 10 mg p.o. twice daily and states she does feel it helps with the throbbing when she lies down.  She thinks she may have been on higher doses in the past but does not remember if it was beneficial or not.  She previously had seen believe Dr. [facility] through [facility] and then most recently saw Dr. Dyane Dust[facility] but has not seen him since November 2021.  She has no other complaints on the exam today.     Review of Systems      Objective:          acetaminophen SR (TYLENOL) 650 mg tablet Take one tablet by mouth as Needed for Pain.    amitriptyline (ELAVIL) 100 mg tablet TAKE 1&1/2 TABS BY MOUTH EVERY NIGHT AT BEDTIME    baclofen (LIORESAL) 20 mg tablet Take one-half tablet by mouth at bedtime daily.    ergocalciferol (vitamin D2) (VITAMIN D PO) Take  by mouth daily.    fexofenadine(+) (ALLEGRA) 180 mg tablet Take one tablet by mouth daily.    gabapentin (NEURONTIN) 800 mg tablet Take one tablet by mouth every 8 hours. (Patient taking differently: Take one tablet by mouth twice daily.)    magnesium oxide 400 mg magnesium tab Take 1 tablet by mouth daily.    NURTEC ODT 75 mg tablet     omeprazole DR (PRILOSEC) 40 mg capsule TAKE 1 CAPSULE BY MOUTH TWICE DAILY 30 MINUTES TO 1 HOUR PRIOR TO FIRST AND LAST MEAL OF THE DAY    other medication one Dose once. Medication Name & Strength: Tumeric    Dose(how many): tablet    Frequency(how often): Daily    propranoloL (INDERAL) 10 mg tablet Take one tablet by mouth twice daily.    riboflavin (vitamin B2) 400 mg tab Take one tablet by mouth daily.    rizatriptan (MAXALT-MLT) 10 mg rapid dissolve tablet Take one tablet by mouth at onset of headache. May repeat after 2 hours. Max of 30 mg in 24 hours.     Vitals:    09/07/22 1028   BP: 107/63   BP Source: Arm, Left Upper   Pulse: 96   Temp: 36.8 ?C (98.2 ?F)   TempSrc: Temporal   PainSc: Seven   Weight: 64.9 kg (143 lb)   Height: 165.1 cm (5' 5)     Body mass index is 23.8 kg/m?Marland Kitchen     Physical Exam  Constitutional:       Appearance: Normal appearance. She is normal weight.   HENT:      Head: Normocephalic and atraumatic.   Eyes:      Extraocular Movements: Extraocular movements intact.      Pupils: Pupils are equal, round, and reactive to light.   Musculoskeletal:      Cervical back: Neck supple.   Neurological:      Mental Status: She is alert and oriented to person, place, and time. Cranial Nerves: Cranial nerves 2-12 are intact.      Sensory: Sensation is intact.      Motor: Motor function is intact. No tremor, atrophy, abnormal muscle tone or  pronator drift.      Coordination: Coordination is intact. Romberg sign negative. Coordination normal. Finger-Nose-Finger Test and Heel to [facility] Test normal. Rapid alternating movements normal.      Gait: Gait is intact.      Deep Tendon Reflexes: Babinski sign absent on the right side. Babinski sign absent on the left side.      Reflex Scores:       Tricep reflexes are 2+ on the right side and 2+ on the left side.       Bicep reflexes are 2+ on the right side and 2+ on the left side.       Brachioradialis reflexes are 2+ on the right side and 2+ on the left side.       Patellar reflexes are 2+ on the right side and 2+ on the left side.       Achilles reflexes are 2+ on the right side and 2+ on the left side.  Psychiatric:         Attention and Perception: Attention and perception normal.         Mood and Affect: Mood normal. Mood is not anxious or depressed.         Speech: Speech normal.         Behavior: Behavior normal.         Thought Content: Thought content normal.              Assessment and Plan:    Problem   Intractable Chronic Migraine Without Aura and With Status Migrainosus    I discussed with her that I do close she has chronic migraine headaches we will have her continue Nurtec 75 mg ODT tablets for preventive therapy which she states has given her some pain reduction with 5 to 8 hours of markedly reduced pain per day when she is taking the medication.  She will try rizatriptan MLT 10 mg as an abortive therapy.  She has tolerated the 10 mg rizatriptan in the past and has a full tablet but hopeful that the meltaway will work faster.  She states that insurance has denied the rizatriptan because she is on Nurtec but she is not using Nurtec as an abortive medication and only as a preventative and therefore there should be no contraindication for the rizatriptan.  In addition, I do feel she would benefit by retrying Botox injections and we will see if we can get approval for the Botox.  I am hopeful that over time may be able to reduce some of her other preventative therapies starting with amitriptyline.  She will call if she is having issues prior to her next visit.        Total Time Today was 53 minutes in the following activities: Preparing to see the patient, Performing a medically appropriate examination and/or evaluation, and Documenting clinical information in the electronic or other health record

## 2022-09-10 ENCOUNTER — Encounter: Admit: 2022-09-10 | Discharge: 2022-09-10 | Payer: BC Managed Care – PPO

## 2022-09-11 ENCOUNTER — Encounter: Admit: 2022-09-11 | Discharge: 2022-09-11 | Payer: BC Managed Care – PPO

## 2022-09-16 ENCOUNTER — Encounter: Admit: 2022-09-16 | Discharge: 2022-09-16 | Payer: BC Managed Care – PPO

## 2022-09-16 MED ORDER — NURTEC ODT 75 MG PO TBDI
75 mg | ORAL_TABLET | Freq: Every day | ORAL | 1 refills | Status: AC | PRN
Start: 2022-09-16 — End: ?
  Filled 2022-09-18: qty 8, 8d supply, fill #1

## 2022-09-17 ENCOUNTER — Encounter: Admit: 2022-09-17 | Discharge: 2022-09-17 | Payer: BC Managed Care – PPO

## 2022-09-18 ENCOUNTER — Encounter: Admit: 2022-09-18 | Discharge: 2022-09-18 | Payer: BC Managed Care – PPO

## 2022-09-29 ENCOUNTER — Encounter: Admit: 2022-09-29 | Discharge: 2022-09-29 | Payer: BC Managed Care – PPO

## 2022-09-29 DIAGNOSIS — G43711 Chronic migraine without aura, intractable, with status migrainosus: Secondary | ICD-10-CM

## 2022-10-20 ENCOUNTER — Encounter: Admit: 2022-10-20 | Discharge: 2022-10-20 | Payer: BC Managed Care – PPO

## 2022-10-21 ENCOUNTER — Encounter: Admit: 2022-10-21 | Discharge: 2022-10-21 | Payer: BC Managed Care – PPO

## 2022-10-22 ENCOUNTER — Encounter: Admit: 2022-10-22 | Discharge: 2022-10-22 | Payer: BC Managed Care – PPO

## 2022-10-23 ENCOUNTER — Encounter: Admit: 2022-10-23 | Discharge: 2022-10-23 | Payer: BC Managed Care – PPO

## 2022-10-24 MED FILL — RIZATRIPTAN 10 MG PO TBDI: 10 mg | 30 days supply | Qty: 12 | Fill #3 | Status: AC

## 2022-10-25 ENCOUNTER — Encounter: Admit: 2022-10-25 | Discharge: 2022-10-25 | Payer: BC Managed Care – PPO

## 2022-10-26 ENCOUNTER — Encounter: Admit: 2022-10-26 | Discharge: 2022-10-26 | Payer: BC Managed Care – PPO

## 2022-10-26 MED FILL — NURTEC ODT 75 MG PO TBDI: 75 mg | ORAL | 8 days supply | Qty: 8 | Fill #2 | Status: CP

## 2022-10-29 ENCOUNTER — Encounter: Admit: 2022-10-29 | Discharge: 2022-10-29 | Payer: BC Managed Care – PPO

## 2022-10-29 MED ORDER — RIMEGEPANT 75 MG PO TBDI
75 mg | ORAL_TABLET | Freq: Every day | ORAL | 1 refills | PRN
Start: 2022-10-29 — End: ?

## 2022-11-01 ENCOUNTER — Encounter: Admit: 2022-11-01 | Discharge: 2022-11-01 | Payer: BC Managed Care – PPO

## 2022-11-01 MED ORDER — RIMEGEPANT 75 MG PO TBDI
75 mg | ORAL_TABLET | Freq: Every day | ORAL | 1 refills | PRN
Start: 2022-11-01 — End: ?

## 2022-11-02 ENCOUNTER — Encounter: Admit: 2022-11-02 | Discharge: 2022-11-02 | Payer: BC Managed Care – PPO

## 2022-11-02 ENCOUNTER — Ambulatory Visit: Admit: 2022-11-02 | Discharge: 2022-11-02 | Payer: BC Managed Care – PPO

## 2022-11-02 DIAGNOSIS — G43711 Chronic migraine without aura, intractable, with status migrainosus: Secondary | ICD-10-CM

## 2022-11-02 DIAGNOSIS — R413 Other amnesia: Secondary | ICD-10-CM

## 2022-11-02 DIAGNOSIS — R1319 Other dysphagia: Secondary | ICD-10-CM

## 2022-11-02 DIAGNOSIS — J45909 Unspecified asthma, uncomplicated: Secondary | ICD-10-CM

## 2022-11-02 DIAGNOSIS — M1990 Unspecified osteoarthritis, unspecified site: Secondary | ICD-10-CM

## 2022-11-02 DIAGNOSIS — G479 Sleep disorder, unspecified: Secondary | ICD-10-CM

## 2022-11-02 DIAGNOSIS — I499 Cardiac arrhythmia, unspecified: Secondary | ICD-10-CM

## 2022-11-02 DIAGNOSIS — R079 Chest pain, unspecified: Secondary | ICD-10-CM

## 2022-11-02 DIAGNOSIS — O039 Complete or unspecified spontaneous abortion without complication: Secondary | ICD-10-CM

## 2022-11-02 DIAGNOSIS — G43909 Migraine, unspecified, not intractable, without status migrainosus: Secondary | ICD-10-CM

## 2022-11-02 DIAGNOSIS — R002 Palpitations: Secondary | ICD-10-CM

## 2022-11-02 MED ORDER — ONABOTULINUMTOXINA 100 UNIT IJ SOLR
155 [IU] | Freq: Once | INTRAMUSCULAR | 0 refills | Status: CP
Start: 2022-11-02 — End: ?
  Administered 2022-11-02: 16:00:00 155 [IU] via INTRAMUSCULAR

## 2022-11-02 NOTE — Progress Notes
Date of Service: 11/02/2022    Subjective:             Penny Townsend is a 57 y.o. female who is in for a procedure only appointment to restart botox injections for treatment of chronic migraine headaches.    History of Present Illness  Penny Townsend today for a procedure only appointment to initiate therapy with Botox injections for treatment of her chronic migraine headaches.  She has had a remote history of Botox use over 10 years ago with moderate benefit but has not had the procedure in multiple years.  She has had migraines since 1997 after having injury to her neck working on her masters in physical therapy.  She does describe the headaches as typically starting in the occipital area with radiation over the crown of the head with a stabbing sharp pressure pounding sensation associated with dizziness, nausea, light and sound sensitivity.  She is having moderate level headaches on a daily basis that will start as a 4/10 when she wakes up but by the end of the day are a 6-7/10 on a 0-10 pain scale.  At least twice a month she is having more severe migraine at 9-10/10 on a 0-10 pain scale.  She does have migraine headaches accounting for all of her headache syndromes but has the more severe headaches only twice a month.  She has tried multiple medications currently still taking amitriptyline 150 mg nightly which she has been on for over 10 years, topiramate which was used in the past without benefit gabapentin which she is continue to take at 800 mg twice daily which helps with some of her overall neuropathic pain.  She has also been on Aimovig but this caused constipation, she tried [employer] but did not see much benefit with the use of [employer] and is currently taking Nurtec 75 mg every other day as abortive therapy and is still having daily migraine headaches at 4-6/10 on a 0-10 pain scale and at least twice a month will have migraines that are a 9-10/10 on a 0-10 pain scale.  As noted she is into initiate therapy with Botox injections for treatment of her chronic migraine headaches.             Objective:         acetaminophen SR (TYLENOL) 650 mg tablet Take one tablet by mouth as Needed for Pain.    amitriptyline (ELAVIL) 100 mg tablet TAKE 1&1/2 TABS BY MOUTH EVERY NIGHT AT BEDTIME    baclofen (LIORESAL) 20 mg tablet Take one-half tablet by mouth at bedtime daily.    ergocalciferol (vitamin D2) (VITAMIN D PO) Take  by mouth daily.    fexofenadine(+) (ALLEGRA) 180 mg tablet Take one tablet by mouth daily.    gabapentin (NEURONTIN) 800 mg tablet Take one tablet by mouth every 8 hours. (Patient taking differently: Take one tablet by mouth twice daily.)    magnesium oxide 400 mg magnesium tab Take 1 tablet by mouth daily.    NURTEC ODT 75 mg tablet     omeprazole DR (PRILOSEC) 40 mg capsule TAKE 1 CAPSULE BY MOUTH TWICE DAILY 30 MINUTES TO 1 HOUR PRIOR TO FIRST AND LAST MEAL OF THE DAY    other medication one Dose once. Medication Name & Strength: Tumeric    Dose(how many): tablet    Frequency(how often): Daily    propranoloL (INDERAL) 10 mg tablet Take one tablet by mouth twice daily.    riboflavin (vitamin B2) 400 mg tab  Take one tablet by mouth daily.    rimegepant (NURTEC ODT) 75 mg rapid dissolve tablet Dissolve one tablet by mouth daily as needed.    rizatriptan (MAXALT-MLT) 10 mg rapid dissolve tablet Take one tablet by mouth at onset of headache. May repeat after 2 hours. Max of 30 mg in 24 hours.     Vitals:    11/02/22 1028   BP: (!) 89/63   Pulse: 87   PainSc: Six     There is no height or weight on file to calculate BMI.     Physical Exam  Procedure: Botox injections for chronic migraine  Indication: Intractable chronic migraines  Consent: Signed informed consent was obtained.  Description: The PREEMPT clinical trial model was followed for the injections.  Needle size: 32-gauge, 1/2 inch  Muscles injected with amounts:  Procerus: 5 units midline  Corrugators: 5 units bilateral (10 units total)   Frontalis: 5 units in 2 sites bilateral (20 units total)  Temporalis: 5 units in 4 sites bilateral (40 units total)  Occipitalis: 5 units in 3 sites bilateral (30 units total)  High cervical paraspinals (5 units in 2 sites bilateral (20 units total)   Trapezius: 5 units in 3 sites bilateral (30 units total)  Total amount injected: 155 units  Unavoidable amount wasted: 45 units  The patient tolerated the procedure well without any observed complications.       Assessment and Plan:    Problem   Intractable Chronic Migraine Without Aura and With Status Migrainosus    She will keep a diary to see if the botox in addition to the nurtec 75mg  every other day is helping to reduce the frequency and intensity of the migraine headaches.

## 2022-11-02 NOTE — Procedures
See procedure note in chart

## 2022-11-03 ENCOUNTER — Encounter: Admit: 2022-11-03 | Discharge: 2022-11-03 | Payer: BC Managed Care – PPO

## 2022-11-03 MED ORDER — RIMEGEPANT 75 MG PO TBDI
75 mg | ORAL_TABLET | Freq: Every day | ORAL | 1 refills | PRN
Start: 2022-11-03 — End: ?

## 2022-11-07 ENCOUNTER — Encounter: Admit: 2022-11-07 | Discharge: 2022-11-07 | Payer: BC Managed Care – PPO

## 2022-11-07 MED ORDER — RIMEGEPANT 75 MG PO TBDI
75 mg | ORAL_TABLET | Freq: Every day | ORAL | 1 refills | PRN
Start: 2022-11-07 — End: ?

## 2022-11-10 ENCOUNTER — Encounter: Admit: 2022-11-10 | Discharge: 2022-11-10 | Payer: BC Managed Care – PPO

## 2022-11-10 DIAGNOSIS — G43009 Migraine without aura, not intractable, without status migrainosus: Secondary | ICD-10-CM

## 2022-11-10 MED ORDER — RIMEGEPANT 75 MG PO TBDI
75 mg | ORAL_TABLET | Freq: Every day | ORAL | 1 refills | Status: AC | PRN
Start: 2022-11-10 — End: ?
  Filled 2022-11-12: qty 8, 8d supply, fill #1

## 2022-11-10 MED ORDER — AMITRIPTYLINE 100 MG PO TAB
ORAL_TABLET | 0 refills
Start: 2022-11-10 — End: ?

## 2022-11-10 MED ORDER — AMITRIPTYLINE 100 MG PO TAB
100 mg | ORAL_TABLET | Freq: Every evening | ORAL | 3 refills | Status: AC
Start: 2022-11-10 — End: ?
  Filled 2022-11-12: qty 30, 30d supply, fill #1

## 2022-11-10 NOTE — Telephone Encounter
Recieved Nurtec and amitriptyline refill request for Penny Townsend. LOV 11/02/22, medication included in plan of care & future appointment scheduled. Nurtec and amitriptyline escribed to pharmacy.

## 2022-11-11 ENCOUNTER — Encounter: Admit: 2022-11-11 | Discharge: 2022-11-11 | Payer: BC Managed Care – PPO

## 2022-11-12 ENCOUNTER — Encounter: Admit: 2022-11-12 | Discharge: 2022-11-12 | Payer: BC Managed Care – PPO

## 2022-11-26 ENCOUNTER — Encounter: Admit: 2022-11-26 | Discharge: 2022-11-26 | Payer: BC Managed Care – PPO

## 2022-11-28 ENCOUNTER — Encounter: Admit: 2022-11-28 | Discharge: 2022-11-28 | Payer: BC Managed Care – PPO

## 2022-11-28 MED FILL — RIMEGEPANT 75 MG PO TBDI: 75 mg | ORAL | 8 days supply | Qty: 8 | Fill #2 | Status: AC

## 2022-11-29 ENCOUNTER — Encounter: Admit: 2022-11-29 | Discharge: 2022-11-29 | Payer: BC Managed Care – PPO

## 2022-11-29 MED ORDER — PROPRANOLOL 10 MG PO TAB
10 mg | ORAL_TABLET | Freq: Two times a day (BID) | ORAL | 6 refills | Status: AC
Start: 2022-11-29 — End: ?
  Filled 2022-11-30: qty 60, 30d supply, fill #1

## 2022-11-29 NOTE — Telephone Encounter
Recieved propranolol refill request for Penny Townsend. LOV 11/02/22, medication included in plan of care & future appointment scheduled. Propranolol escribed to pharmacy.

## 2022-11-30 ENCOUNTER — Encounter: Admit: 2022-11-30 | Discharge: 2022-11-30 | Payer: BC Managed Care – PPO

## 2022-12-01 ENCOUNTER — Encounter: Admit: 2022-12-01 | Discharge: 2022-12-01 | Payer: BC Managed Care – PPO

## 2022-12-01 DIAGNOSIS — G43009 Migraine without aura, not intractable, without status migrainosus: Secondary | ICD-10-CM

## 2022-12-01 MED ORDER — RIMEGEPANT 75 MG PO TBDI
75 mg | ORAL_TABLET | Freq: Every day | ORAL | 3 refills | Status: AC | PRN
Start: 2022-12-01 — End: ?
  Filled 2022-12-18: qty 8, 8d supply, fill #1

## 2022-12-01 MED ORDER — AMITRIPTYLINE 100 MG PO TAB
100 mg | ORAL_TABLET | Freq: Two times a day (BID) | ORAL | 3 refills | Status: AC
Start: 2022-12-01 — End: ?
  Filled 2022-12-01: qty 60, 30d supply, fill #1

## 2022-12-01 NOTE — Telephone Encounter
Ward, Erie Noe, DO  Leone Brand, RN  That is fine          Previous Messages       ----- Message -----  From: Leone Brand, RN  Sent: 11/30/2022   3:40 PM CDT  To: Erie Noe Ward, DO    Meerab would like her amitriptyline to be 100 mg twice a day... Is this ok?            Updated script sent to pharmacy.

## 2022-12-01 NOTE — Telephone Encounter
Recieved nurtec refill request for Penny Townsend. LOV 11/02/22, medication included in plan of care & future appointment scheduled. Nurtec escribed to pharmacy.

## 2022-12-09 ENCOUNTER — Encounter: Admit: 2022-12-09 | Discharge: 2022-12-09 | Payer: BC Managed Care – PPO

## 2022-12-10 ENCOUNTER — Encounter: Admit: 2022-12-10 | Discharge: 2022-12-10 | Payer: BC Managed Care – PPO

## 2022-12-10 DIAGNOSIS — H4922 Sixth [abducent] nerve palsy, left eye: Secondary | ICD-10-CM

## 2022-12-10 DIAGNOSIS — H5052 Exophoria: Secondary | ICD-10-CM

## 2022-12-10 DIAGNOSIS — H04123 Dry eye syndrome of bilateral lacrimal glands: Secondary | ICD-10-CM

## 2022-12-10 DIAGNOSIS — G43009 Migraine without aura, not intractable, without status migrainosus: Secondary | ICD-10-CM

## 2022-12-16 ENCOUNTER — Encounter: Admit: 2022-12-16 | Discharge: 2022-12-16 | Payer: BC Managed Care – PPO

## 2022-12-17 ENCOUNTER — Encounter: Admit: 2022-12-17 | Discharge: 2022-12-17 | Payer: BC Managed Care – PPO

## 2022-12-29 ENCOUNTER — Encounter: Admit: 2022-12-29 | Discharge: 2022-12-29 | Payer: BC Managed Care – PPO

## 2022-12-30 ENCOUNTER — Encounter: Admit: 2022-12-30 | Discharge: 2022-12-30 | Payer: BC Managed Care – PPO

## 2023-01-02 ENCOUNTER — Encounter: Admit: 2023-01-02 | Discharge: 2023-01-02 | Payer: BC Managed Care – PPO

## 2023-01-02 MED FILL — RIMEGEPANT 75 MG PO TBDI: 75 mg | ORAL | 8 days supply | Qty: 8 | Fill #2 | Status: AC

## 2023-01-02 MED FILL — AMITRIPTYLINE 100 MG PO TAB: 100 mg | ORAL | 30 days supply | Qty: 60 | Fill #2 | Status: AC

## 2023-01-04 ENCOUNTER — Encounter: Admit: 2023-01-04 | Discharge: 2023-01-04 | Payer: BC Managed Care – PPO

## 2023-01-04 DIAGNOSIS — G43009 Migraine without aura, not intractable, without status migrainosus: Secondary | ICD-10-CM

## 2023-01-21 ENCOUNTER — Encounter: Admit: 2023-01-21 | Discharge: 2023-01-21 | Payer: BC Managed Care – PPO

## 2023-01-22 ENCOUNTER — Encounter: Admit: 2023-01-22 | Discharge: 2023-01-22 | Payer: BC Managed Care – PPO

## 2023-01-23 ENCOUNTER — Encounter: Admit: 2023-01-23 | Discharge: 2023-01-23 | Payer: BC Managed Care – PPO

## 2023-01-23 MED FILL — RIMEGEPANT 75 MG PO TBDI: 75 mg | ORAL | 8 days supply | Qty: 8 | Fill #3 | Status: AC

## 2023-01-25 ENCOUNTER — Encounter: Admit: 2023-01-25 | Discharge: 2023-01-25 | Payer: BC Managed Care – PPO

## 2023-01-28 ENCOUNTER — Encounter: Admit: 2023-01-28 | Discharge: 2023-01-28 | Payer: BC Managed Care – PPO

## 2023-01-30 ENCOUNTER — Encounter: Admit: 2023-01-30 | Discharge: 2023-01-30 | Payer: BC Managed Care – PPO

## 2023-01-31 ENCOUNTER — Encounter: Admit: 2023-01-31 | Discharge: 2023-01-31 | Payer: BC Managed Care – PPO

## 2023-01-31 MED FILL — AMITRIPTYLINE 100 MG PO TAB: 100 mg | ORAL | 30 days supply | Qty: 60 | Fill #3 | Status: AC

## 2023-02-01 ENCOUNTER — Encounter: Admit: 2023-02-01 | Discharge: 2023-02-01 | Payer: BC Managed Care – PPO

## 2023-02-01 MED FILL — PROPRANOLOL 10 MG PO TAB: 10 mg | ORAL | 30 days supply | Qty: 60 | Fill #2 | Status: AC

## 2023-02-07 ENCOUNTER — Encounter: Admit: 2023-02-07 | Discharge: 2023-02-07 | Payer: BC Managed Care – PPO

## 2023-02-08 ENCOUNTER — Encounter: Admit: 2023-02-08 | Discharge: 2023-02-08 | Payer: BC Managed Care – PPO

## 2023-02-09 MED FILL — RIMEGEPANT 75 MG PO TBDI: 75 mg | ORAL | 8 days supply | Qty: 8 | Fill #4 | Status: AC

## 2023-02-11 ENCOUNTER — Encounter: Admit: 2023-02-11 | Discharge: 2023-02-11 | Payer: BC Managed Care – PPO

## 2023-02-11 MED ORDER — RIMEGEPANT 75 MG PO TBDI
75 mg | ORAL_TABLET | Freq: Every day | ORAL | 3 refills | PRN
Start: 2023-02-11 — End: ?

## 2023-02-15 ENCOUNTER — Encounter: Admit: 2023-02-15 | Discharge: 2023-02-15 | Payer: BC Managed Care – PPO

## 2023-02-19 ENCOUNTER — Encounter: Admit: 2023-02-19 | Discharge: 2023-02-19 | Payer: BC Managed Care – PPO

## 2023-02-23 ENCOUNTER — Encounter: Admit: 2023-02-23 | Discharge: 2023-02-23 | Payer: BC Managed Care – PPO

## 2023-02-23 MED FILL — RIMEGEPANT 75 MG PO TBDI: 75 mg | ORAL | 8 days supply | Qty: 8 | Fill #1 | Status: AC

## 2023-03-02 ENCOUNTER — Encounter: Admit: 2023-03-02 | Discharge: 2023-03-02 | Payer: BC Managed Care – PPO

## 2023-03-05 ENCOUNTER — Encounter: Admit: 2023-03-05 | Discharge: 2023-03-05 | Payer: BC Managed Care – PPO

## 2023-03-06 MED FILL — AMITRIPTYLINE 100 MG PO TAB: 100 mg | ORAL | 30 days supply | Qty: 60 | Fill #4 | Status: AC

## 2023-03-11 ENCOUNTER — Encounter: Admit: 2023-03-11 | Discharge: 2023-03-11 | Payer: BC Managed Care – PPO

## 2023-03-14 ENCOUNTER — Encounter: Admit: 2023-03-14 | Discharge: 2023-03-14 | Payer: BC Managed Care – PPO

## 2023-03-14 MED FILL — RIMEGEPANT 75 MG PO TBDI: 75 mg | ORAL | 8 days supply | Qty: 8 | Fill #2 | Status: AC

## 2023-03-14 MED FILL — RIZATRIPTAN 10 MG PO TBDI: 10 mg | 30 days supply | Qty: 12 | Fill #4 | Status: AC

## 2023-03-31 ENCOUNTER — Encounter: Admit: 2023-03-31 | Discharge: 2023-03-31 | Payer: BC Managed Care – PPO

## 2023-04-01 ENCOUNTER — Encounter: Admit: 2023-04-01 | Discharge: 2023-04-01 | Payer: BC Managed Care – PPO

## 2023-04-05 ENCOUNTER — Encounter: Admit: 2023-04-05 | Discharge: 2023-04-05 | Payer: BC Managed Care – PPO

## 2023-04-05 MED FILL — RIMEGEPANT 75 MG PO TBDI: 75 mg | ORAL | 8 days supply | Qty: 8 | Fill #3 | Status: AC

## 2023-04-19 ENCOUNTER — Encounter: Admit: 2023-04-19 | Discharge: 2023-04-19 | Payer: BC Managed Care – PPO

## 2023-05-02 ENCOUNTER — Encounter: Admit: 2023-05-02 | Discharge: 2023-05-02 | Payer: BC Managed Care – PPO

## 2023-05-03 ENCOUNTER — Encounter: Admit: 2023-05-03 | Discharge: 2023-05-03 | Payer: BC Managed Care – PPO

## 2023-05-03 MED ORDER — RIMEGEPANT 75 MG PO TBDI
75 mg | ORAL_TABLET | Freq: Every day | ORAL | 3 refills | Status: AC | PRN
Start: 2023-05-03 — End: ?
  Filled 2023-05-06: qty 8, 30d supply, fill #1

## 2023-05-04 ENCOUNTER — Encounter: Admit: 2023-05-04 | Discharge: 2023-05-04 | Payer: BC Managed Care – PPO

## 2023-05-05 ENCOUNTER — Encounter: Admit: 2023-05-05 | Discharge: 2023-05-05 | Payer: BC Managed Care – PPO

## 2023-05-05 MED ORDER — BACLOFEN 20 MG PO TAB
10 mg | ORAL_TABLET | Freq: Every evening | ORAL | 3 refills | Status: AC
Start: 2023-05-05 — End: ?

## 2023-05-05 NOTE — Telephone Encounter
Ward, Erie Noe, DO  Leone Brand, RN  Yes, please send this for her with refills          Previous Messages       ----- Message -----  From: Leone Brand, RN  Sent: 05/05/2023   9:31 AM CST  To: Erie Noe Ward, DO    Are you ok with refilling Heathers baclofen 10mg  once a day?

## 2023-05-06 ENCOUNTER — Encounter: Admit: 2023-05-06 | Discharge: 2023-05-06 | Payer: BC Managed Care – PPO

## 2023-05-07 ENCOUNTER — Encounter: Admit: 2023-05-07 | Discharge: 2023-05-07 | Payer: BC Managed Care – PPO

## 2023-05-09 ENCOUNTER — Encounter: Admit: 2023-05-09 | Discharge: 2023-05-09 | Payer: BC Managed Care – PPO

## 2023-05-10 ENCOUNTER — Encounter: Admit: 2023-05-10 | Discharge: 2023-05-10 | Payer: BC Managed Care – PPO

## 2023-05-17 ENCOUNTER — Encounter: Admit: 2023-05-17 | Discharge: 2023-05-17 | Payer: BC Managed Care – PPO

## 2023-06-06 ENCOUNTER — Encounter: Admit: 2023-06-06 | Discharge: 2023-06-06 | Payer: BC Managed Care – PPO

## 2023-06-06 DIAGNOSIS — G43009 Migraine without aura, not intractable, without status migrainosus: Secondary | ICD-10-CM

## 2023-06-06 MED ORDER — AMITRIPTYLINE 100 MG PO TAB
100 mg | ORAL_TABLET | Freq: Two times a day (BID) | ORAL | 0 refills | Status: AC
Start: 2023-06-06 — End: ?

## 2023-06-06 NOTE — Telephone Encounter
 Recieved amitriptyline refill request for Penny Townsend. LOV 11/02/22, medication included in plan of care & future appointment scheduled. Amitriptyline escribed to pharmacy.

## 2023-06-10 ENCOUNTER — Encounter: Admit: 2023-06-10 | Discharge: 2023-06-10 | Payer: BC Managed Care – PPO

## 2023-06-11 ENCOUNTER — Encounter: Admit: 2023-06-11 | Discharge: 2023-06-11 | Payer: BC Managed Care – PPO

## 2023-06-13 ENCOUNTER — Encounter: Admit: 2023-06-13 | Discharge: 2023-06-13 | Payer: BC Managed Care – PPO

## 2023-06-13 MED ORDER — RIMEGEPANT 75 MG PO TBDI
75 mg | ORAL_TABLET | Freq: Every day | ORAL | 3 refills | Status: AC | PRN
Start: 2023-06-13 — End: ?

## 2023-06-13 MED ORDER — RIMEGEPANT 75 MG PO TBDI
75 mg | ORAL_TABLET | Freq: Every day | ORAL | 3 refills | Status: DC | PRN
Start: 2023-06-13 — End: 2023-06-13

## 2023-06-14 ENCOUNTER — Encounter: Admit: 2023-06-14 | Discharge: 2023-06-14 | Payer: BC Managed Care – PPO

## 2023-06-17 ENCOUNTER — Encounter: Admit: 2023-06-17 | Discharge: 2023-06-17 | Payer: BC Managed Care – PPO

## 2023-06-17 NOTE — Progress Notes
 Nurtec PA # [id number] for Kirrah Mustin through [facility] to [phone]

## 2023-06-22 ENCOUNTER — Encounter: Admit: 2023-06-22 | Discharge: 2023-06-22 | Payer: BC Managed Care – PPO

## 2023-06-22 NOTE — Progress Notes
 Penny Townsend has been using Nurtec 75 mg ODT tablets every other day for preventive therapy for her migraine headaches.  She is currently not receiving Botox injections and is not on any other CGRP blocker as a preventive medication.  I am requesting that her insurance cover the Nurtec 75 mg ODT every other day dosage for preventive therapy for her migraine headaches.  Erie Noe. Vesta Wheeland, D.O.

## 2023-06-23 ENCOUNTER — Encounter: Admit: 2023-06-23 | Discharge: 2023-06-23 | Payer: BC Managed Care – PPO

## 2023-07-14 ENCOUNTER — Encounter: Admit: 2023-07-14 | Discharge: 2023-07-14 | Payer: BC Managed Care – PPO

## 2023-07-20 ENCOUNTER — Encounter: Admit: 2023-07-20 | Discharge: 2023-07-20 | Payer: BC Managed Care – PPO

## 2023-08-05 ENCOUNTER — Encounter: Admit: 2023-08-05 | Discharge: 2023-08-05 | Payer: BLUE CROSS/BLUE SHIELD

## 2023-08-06 ENCOUNTER — Encounter: Admit: 2023-08-06 | Discharge: 2023-08-06 | Payer: BLUE CROSS/BLUE SHIELD

## 2023-08-17 ENCOUNTER — Encounter: Admit: 2023-08-17 | Discharge: 2023-08-17 | Payer: PRIVATE HEALTH INSURANCE

## 2023-08-17 ENCOUNTER — Ambulatory Visit: Admit: 2023-08-17 | Discharge: 2023-08-18 | Payer: PRIVATE HEALTH INSURANCE

## 2023-08-17 ENCOUNTER — Ambulatory Visit: Admit: 2023-08-17 | Discharge: 2023-08-17 | Payer: PRIVATE HEALTH INSURANCE

## 2023-08-18 ENCOUNTER — Encounter: Admit: 2023-08-18 | Discharge: 2023-08-18 | Payer: PRIVATE HEALTH INSURANCE

## 2023-08-18 MED ORDER — PROPRANOLOL 10 MG PO TAB
10 mg | ORAL_TABLET | Freq: Two times a day (BID) | ORAL | 4 refills | 30.00000 days | Status: AC
Start: 2023-08-18 — End: ?

## 2023-08-18 NOTE — Telephone Encounter
 Recieved propranolol  refill request for Penny Townsend. LOV 06/22/23, medication included in plan of care & future appointment scheduled. Propranolol  escribed to pharmacy.

## 2023-08-19 ENCOUNTER — Encounter: Admit: 2023-08-19 | Discharge: 2023-08-19 | Payer: PRIVATE HEALTH INSURANCE

## 2023-08-19 MED ORDER — METHYLPREDNISOLONE 4 MG PO DSPK
ORAL_TABLET | ORAL | 0 refills | 6.00000 days | Status: AC
Start: 2023-08-19 — End: ?

## 2023-08-19 NOTE — Progress Notes
 Ward, Kristan Petit, DO  Ambert Virrueta, RN  Yes, she can have one          Previous Messages       ----- Message -----  From: Noland Battles, RN  Sent: 08/19/2023  11:47 AM CDT  To: Kristan Petit Ward, DO    Can she have a medrol dose pak to break up the migraine cycle she's currently in?

## 2023-08-22 ENCOUNTER — Encounter: Admit: 2023-08-22 | Discharge: 2023-08-22 | Payer: PRIVATE HEALTH INSURANCE

## 2023-08-22 DIAGNOSIS — G43009 Migraine without aura, not intractable, without status migrainosus: Secondary | ICD-10-CM

## 2023-08-22 MED ORDER — RIMEGEPANT 75 MG PO TBDI
75 mg | ORAL_TABLET | Freq: Every day | ORAL | 3 refills | 30.00000 days | Status: AC | PRN
Start: 2023-08-22 — End: ?

## 2023-08-22 MED ORDER — AMITRIPTYLINE 100 MG PO TAB
100 mg | ORAL_TABLET | Freq: Two times a day (BID) | ORAL | 0 refills | 30.00000 days | Status: AC
Start: 2023-08-22 — End: ?

## 2023-08-23 ENCOUNTER — Encounter: Admit: 2023-08-23 | Discharge: 2023-08-23 | Payer: PRIVATE HEALTH INSURANCE

## 2023-08-24 ENCOUNTER — Ambulatory Visit: Admit: 2023-08-24 | Discharge: 2023-08-25 | Payer: PRIVATE HEALTH INSURANCE

## 2023-08-24 ENCOUNTER — Encounter: Admit: 2023-08-24 | Discharge: 2023-08-24 | Payer: PRIVATE HEALTH INSURANCE

## 2023-08-24 DIAGNOSIS — G43009 Migraine without aura, not intractable, without status migrainosus: Secondary | ICD-10-CM

## 2023-08-24 DIAGNOSIS — G43711 Chronic migraine without aura, intractable, with status migrainosus: Secondary | ICD-10-CM

## 2023-08-24 MED ORDER — PROPRANOLOL 20 MG PO TAB
20 mg | ORAL_TABLET | Freq: Three times a day (TID) | ORAL | 5 refills | 30.00000 days | Status: AC
Start: 2023-08-24 — End: ?

## 2023-08-24 MED ORDER — AMITRIPTYLINE 100 MG PO TAB
200 mg | ORAL_TABLET | Freq: Every evening | ORAL | 5 refills | 30.00000 days | Status: AC
Start: 2023-08-24 — End: ?

## 2023-08-24 MED ORDER — RIMEGEPANT 75 MG PO TBDI
75 mg | ORAL_TABLET | ORAL | 11 refills | 30.00000 days | Status: AC
Start: 2023-08-24 — End: ?

## 2023-08-24 MED ORDER — RIMEGEPANT 75 MG PO TBDI
75 mg | ORAL_TABLET | ORAL | 11 refills | 30.00000 days | Status: DC
Start: 2023-08-24 — End: 2023-08-24

## 2023-08-24 NOTE — Progress Notes
 Date of Service: 08/24/2023    Subjective:             Penny Townsend is a 58 y.o. female who is being seen by telehealth visit (video and audio components) due to a history of migraine headaches.    History of Present Illness  Penny Townsend is a very pleasant 58 year old right-hand-dominant female who is being seen by telehealth visit (video and audio components) in follow up  regarding a history of headaches.  She has had migraine headaches since  high school but states she really began having trouble in 1997.  At that time, she was working on her masters in physical therapy and was trying to lift a patient and ripped her supraspinatus muscle and had to have surgery and after that had some nerve injury to the neck.  She states that since 1997 she has been having very frequent migrainous headaches and over the last multiple years she has had headaches on a daily basis.  She states that she has headaches that typically start in the occipital area of her head and will radiate to the crown of the head with a stabbing sharp pain with pressure and pounding associated with dizziness nausea and light and sound sensitivity.  She states that on a daily basis she has a moderate level headache that starts as a 4 when she wakes up but by the end of the day is a 6-7/10 on a 0-10 pain scale.  She states that at least twice a month she will have a severe headache that is a 9-10/10 on a 0-10 pain scale and states it feels as if her eyes are vibrating and her brain is vibrating and she has severe nausea and vomiting.  She does note that bright lights/for out flashing lights loud noises physical exertion and massaging around her head and neck can increase her headaches.  She does have to lie down when the headaches are more severe.  She states that when the more severe headaches hit she will feel increased pressure in her temples and increasing nausea and knows that she is going to have a bad headache.  She states that the severe headaches will be bad first 2 days and then she has a headache after mouth for 2 days prior to going back to her normal daily headache.      She has been on multiple medications and is currently still taking amitriptyline 200 mg nightly which she has been on for more than 10 years which helps her to a degree with the pain intensity.   She has tried sumatriptan as an abortive medicine without benefit.  She has been on Topamax in the past without benefit.   She has been continued on propranolol 10 mg p.o. twice daily and states she does feel it helps with the throbbing when she lies down.  She thinks she may have been on higher doses in the past but does not remember if it was beneficial or not.  She  continues to take gabapentin 800 mg twice daily which may give some benefit in the overall pain severity on the day-to-day basis.  She states she has tried taking gabapentin at higher dosing but has dizziness with the medication at higher doses.  She has tried Aimovig which caused constipation but was helpful, she tried [employer] which did not seem to give her much benefit she has used diclofenac as an abortive agent in the recent past and states that she has been on  rizatriptan oral tablets which do help her but she has been unable to get them since she has been on Nurtec.  She is currently using Nurtec 75 mg every other day as a preventative therapy and does not have an abortive medication.  She had prior to that been on Qulipta 10 mg daily and states that she did not feel it was helping her.   She did  Botox injections for a year and initially felt it was helpful but then the benefit seem to wear off but this was well over 10 years ago and she has never tried it in combination with the CGRP blockers.  She did have a series of Botox injections in July 2024 but states that she was unable to continue the Botox and does not remember why.  She had only had 1 round of the Botox in July and is uncertain if it gave her benefit but likely she was not getting insurance coverage for the therapy.  She states that she has had difficulty getting her Nurtec on an every other day basis and was out of it for over a month and has had daily migrainous headaches again.  She states in the last month or 2 she has been back on Nurtec every other day which does help to reduce her pain from a 8-10/10 down to a 6-7/10 and she states that when she has the Nurtec she may have 8 hours in the day where she does not have pain.  She states that she still is unable to get rizatriptan as an abortive agent but in looking at her chart this may have been due to the fact that the Nurtec was accidentally sent in with directions for abortive and not preventative use.  She has no other complaints on the exam today.            Objective:         acetaminophen SR (TYLENOL) 650 mg tablet Take one tablet by mouth as Needed for Pain.    amitriptyline (ELAVIL) 100 mg tablet Take two tablets by mouth at bedtime daily.    baclofen (LIORESAL) 20 mg tablet Take one-half tablet by mouth at bedtime daily.    ergocalciferol (vitamin D2) (VITAMIN D PO) Take  by mouth daily.    fexofenadine(+) (ALLEGRA) 180 mg tablet Take one tablet by mouth daily.    gabapentin (NEURONTIN) 800 mg tablet Take one tablet by mouth every 8 hours. (Patient taking differently: Take one tablet by mouth twice daily.)    linaCLOtide (LINZESS) 145 mcg capsule Take one capsule by mouth daily 30 minutes before breakfast.    magnesium oxide 400 mg magnesium tab Take 1 tablet by mouth daily.    methylPREDNIsolone (MEDROL (PAK)) 4 mg tablet Take medication as directed on package for 6 days. Take with food.    omeprazole DR (PRILOSEC) 40 mg capsule TAKE 1 CAPSULE BY MOUTH TWICE DAILY 30 MINUTES TO 1 HOUR PRIOR TO FIRST AND LAST MEAL OF THE DAY    propranoloL (INDERAL) 20 mg tablet Take one tablet by mouth three times daily.    riboflavin (vitamin B2) 400 mg tab Take one tablet by mouth daily.    rimegepant (NURTEC ODT) 75 mg rapid dissolve tablet Dissolve one tablet by mouth every 48 hours. Place on or under the tongue. Max 75 mg in 24 hours, and 18 doses in 30 days.    rizatriptan (MAXALT-MLT) 10 mg rapid dissolve tablet Take one tablet by mouth at onset  of headache. May repeat after 2 hours. Max of 30 mg in 24 hours.     Vitals:    08/24/23 0738   PainSc: Five   Weight: 59 kg (130 lb)   Height: 165.1 cm (5' 5)     Body mass index is 21.63 kg/m?Aaron Aas     Physical Exam    Alert and oriented x 4, affect and mood normal  Cranial nerves: 2 through 12 intact  Speech: Unremarkable  Motor: moving upper extremities equally  Coordination: No noted dysmetria with upper limb movements.     Assessment and Plan:    Problem   Intractable Chronic Migraine Without Aura and With Status Migrainosus    She will continue Nurtec 75 mg ODT tablets every other day as preventive therapy.  We did discuss trying to get Botox approval again but if she has to make a choice between the Nurtec or the Botox she states that she is going to stay with Nurtec as she is hesitant to come off of the therapy that is even giving her partial benefit.  She will continue amitriptyline 200 mg nightly as well as her gabapentin.  I have also discussed with her that she can try to titrate propranolol up to 20 mg 3 times daily.  She has had some reported low blood pressures and if she is feeling lightheaded or lethargic with the increase in the propranolol she will reduce to the highest tolerated dosage or discontinue the therapy.  I also feel that she needs an abortive medication and I asked her to talk with her insurance company again about the use of Maxalt as an abortive agent.  She has tried and failed sumatriptan but if there is another triptan that would be available to her we can certainly prescribe this.        Total Time Today was 31 minutes in the following activities: Preparing to see the patient, Performing a medically appropriate examination and/or evaluation, and Documenting clinical information in the electronic or other health record

## 2023-08-24 NOTE — Patient Instructions
 Increase propranolol to 20mg  starting with 1 tablet twice daily and if tolerating  but no benefit after 2 weeks increase to 1 pill 3 times daily.

## 2023-08-25 ENCOUNTER — Encounter: Admit: 2023-08-25 | Discharge: 2023-08-25 | Payer: PRIVATE HEALTH INSURANCE

## 2023-08-27 ENCOUNTER — Encounter: Admit: 2023-08-27 | Discharge: 2023-08-27 | Payer: PRIVATE HEALTH INSURANCE

## 2023-09-12 ENCOUNTER — Encounter: Admit: 2023-09-12 | Discharge: 2023-09-12 | Payer: PRIVATE HEALTH INSURANCE

## 2023-09-12 MED ORDER — RIMEGEPANT 75 MG PO TBDI
75 mg | ORAL_TABLET | ORAL | 11 refills | 30.00000 days | Status: AC
Start: 2023-09-12 — End: ?

## 2023-09-14 ENCOUNTER — Encounter: Admit: 2023-09-14 | Discharge: 2023-09-14 | Payer: PRIVATE HEALTH INSURANCE

## 2023-09-15 ENCOUNTER — Encounter: Admit: 2023-09-15 | Discharge: 2023-09-15 | Payer: PRIVATE HEALTH INSURANCE

## 2023-09-19 ENCOUNTER — Encounter: Admit: 2023-09-19 | Discharge: 2023-09-19 | Payer: PRIVATE HEALTH INSURANCE

## 2023-09-20 ENCOUNTER — Encounter: Admit: 2023-09-20 | Discharge: 2023-09-20 | Payer: PRIVATE HEALTH INSURANCE

## 2023-09-20 DIAGNOSIS — G4730 Sleep apnea, unspecified: Secondary | ICD-10-CM

## 2023-09-23 ENCOUNTER — Encounter: Admit: 2023-09-23 | Discharge: 2023-09-23 | Payer: PRIVATE HEALTH INSURANCE

## 2023-09-24 ENCOUNTER — Encounter: Admit: 2023-09-24 | Discharge: 2023-09-24 | Payer: PRIVATE HEALTH INSURANCE

## 2023-09-25 ENCOUNTER — Encounter: Admit: 2023-09-25 | Discharge: 2023-09-25 | Payer: PRIVATE HEALTH INSURANCE

## 2023-09-26 ENCOUNTER — Encounter: Admit: 2023-09-26 | Discharge: 2023-09-26 | Payer: PRIVATE HEALTH INSURANCE

## 2023-09-27 ENCOUNTER — Encounter: Admit: 2023-09-27 | Discharge: 2023-09-27 | Payer: PRIVATE HEALTH INSURANCE

## 2023-09-27 ENCOUNTER — Ambulatory Visit: Admit: 2023-09-27 | Discharge: 2023-09-27 | Payer: PRIVATE HEALTH INSURANCE

## 2023-09-28 ENCOUNTER — Encounter: Admit: 2023-09-28 | Discharge: 2023-09-28 | Payer: PRIVATE HEALTH INSURANCE

## 2023-09-30 ENCOUNTER — Encounter: Admit: 2023-09-30 | Discharge: 2023-09-30 | Payer: PRIVATE HEALTH INSURANCE

## 2023-09-30 ENCOUNTER — Ambulatory Visit: Admit: 2023-09-30 | Discharge: 2023-09-30 | Payer: PRIVATE HEALTH INSURANCE

## 2023-09-30 MED ORDER — GLYCOPYRROLATE 0.2 MG/ML IJ SOLN
INTRAVENOUS | 0 refills | Status: DC
Start: 2023-09-30 — End: 2023-09-30

## 2023-09-30 MED ORDER — DEXMEDETOMIDINE IN 0.9 % NACL 20 MCG/5 ML (4 MCG/ML) IV SYRG
INTRAVENOUS | 0 refills | Status: DC
Start: 2023-09-30 — End: 2023-09-30

## 2023-09-30 MED ORDER — LIDOCAINE (PF) 100 MG/5 ML (2 %) IV SYRG
INTRAVENOUS | 0 refills | Status: DC
Start: 2023-09-30 — End: 2023-09-30

## 2023-09-30 MED ORDER — PROPOFOL 10 MG/ML IV EMUL 50 ML (INFUSION)(AM)(OR)
INTRAVENOUS | 0 refills | Status: DC
Start: 2023-09-30 — End: 2023-09-30

## 2023-10-01 ENCOUNTER — Encounter: Admit: 2023-10-01 | Discharge: 2023-10-01 | Payer: PRIVATE HEALTH INSURANCE

## 2023-10-04 ENCOUNTER — Encounter: Admit: 2023-10-04 | Discharge: 2023-10-04 | Payer: PRIVATE HEALTH INSURANCE

## 2023-10-06 ENCOUNTER — Encounter: Admit: 2023-10-06 | Discharge: 2023-10-06 | Payer: PRIVATE HEALTH INSURANCE

## 2023-10-06 ENCOUNTER — Ambulatory Visit: Admit: 2023-10-06 | Discharge: 2023-10-07 | Payer: PRIVATE HEALTH INSURANCE

## 2023-10-06 DIAGNOSIS — G43711 Chronic migraine without aura, intractable, with status migrainosus: Secondary | ICD-10-CM

## 2023-10-06 MED ORDER — RIZATRIPTAN 10 MG PO TBDI
ORAL_TABLET | ORAL | 11 refills | 30.00000 days | Status: AC
Start: 2023-10-06 — End: ?

## 2023-10-06 MED ORDER — BACLOFEN 20 MG PO TAB
20 mg | ORAL_TABLET | Freq: Every evening | ORAL | 3 refills | 30.00000 days | Status: AC
Start: 2023-10-06 — End: ?

## 2023-10-06 MED ORDER — RIMEGEPANT 75 MG PO TBDI
75 mg | ORAL_TABLET | ORAL | 11 refills | 30.00000 days | Status: AC
Start: 2023-10-06 — End: ?

## 2023-10-06 NOTE — Progress Notes
 Date of Service: 10/06/2023    Subjective:             Penny Townsend is a 58 y.o. female who is being seen by telehealth visit (real time video and audio communication being utilized) due to a history of migraine headaches.     History of Present Illness  Penny Townsend is a very pleasant 58 year old right-hand-dominant female who is being seen by telehealth visit (real time video and audio communication utilized) in follow up  regarding a history of headaches.  She has had migraine headaches since  high school but states she really began having trouble in 1997.  At that time, she was working on her masters in physical therapy and was trying to lift a patient and ripped her supraspinatus muscle and had to have surgery and after that had some nerve injury to the neck.  She states that since 1997 she has been having very frequent migrainous headaches and over the last multiple years she has had headaches on a daily basis.  She states that she has headaches that typically start in the occipital area of her head and will radiate to the crown of the head with a stabbing sharp pain with pressure and pounding associated with dizziness nausea and light and sound sensitivity.  She states that on a daily basis she has a moderate level headache that starts as a 4 when she wakes up but by the end of the day is a 6-7/10 on a 0-10 pain scale.  She states that at least twice a month she will have a severe headache that is a 9-10/10 on a 0-10 pain scale and states it feels as if her eyes are vibrating and her brain is vibrating and she has severe nausea and vomiting.  She does note that bright lights/for out flashing lights loud noises physical exertion and massaging around her head and neck can increase her headaches.  She does have to lie down when the headaches are more severe.  She states that when the more severe headaches hit she will feel increased pressure in her temples and increasing nausea and knows that she is going to have a bad headache.  She is currently taking Nurtec 75 mg every other day and does state that this is helping with the migraines.  She states that she is not having them throughout the entire day any longer but will have a migraine that will start towards the late afternoon or early evening.    She has been on multiple medications and is currently still taking amitriptyline 200 mg nightly which she has been on for more than 10 years which helps her to a degree with the pain intensity.   She has tried sumatriptan as an abortive medicine without benefit.  She has been on Topamax in the past without benefit.   She has been continued on propranolol 10 mg p.o. and tried to titrate up to 20mg  bid but had severe dizziness and has reduced to 10mg  nighlty and states she does feel it helps with the throbbing when she lies down.  She  continues to take gabapentin 800 mg twice daily which may give some benefit in the overall pain severity on the day-to-day basis.  She states she has tried taking gabapentin at higher dosing but has dizziness with the medication at higher doses.  She has tried Aimovig which caused constipation but was helpful, she tried [employer] which did not seem to give her much benefit she has  used diclofenac as an abortive agent in the recent past and states that she has been on rizatriptan oral tablets which do help her but she has been unable to get them since she has been on Nurtec.  That the Nurtec 75 mg every other day dose has helped her reduce her headaches to about once per day which is less intense.  She states that she is not dealing with migraine and tired day.  She has  had trouble with the pharmacy only prescribing 5 Nurtec at a time 1 has to go multiple times a week to get the medication to use the every other day therapy.  In looking at prescription this may have been since incorrectly as an abortive treatment only.  She has also been given a prescription for rizatriptan MLT but states that the insurance will not pay for both rizatriptan and Nurtec even though the Nurtec is being used as a preventative and rizatriptan as an abortive therapy.  She had prior to that been on Qulipta 10 mg daily and states that she did not feel it was helping her.   She did  Botox injections for a year and initially felt it was helpful but then the benefit seem to wear off but this was well over 10 years ago and she has never tried it in combination with the CGRP blockers.  She did have a series of Botox injections in July 2024 but states that she was unable to continue the Botox and does not remember why.  She had only had 1 round of the Botox in July and is uncertain if it gave her benefit but likely she was not getting insurance coverage for the therapy.  She has tried turmeric for the migraines as well and is trying to do home exercises/stretching to see if this will help with the migraines.  She states that the turmeric really gave her no effect at all.  She is continue to take magnesium on a daily basis.  She has no other complaints on exam today.           Objective:         acetaminophen SR (TYLENOL) 650 mg tablet Take one tablet by mouth as Needed for Pain.    amitriptyline (ELAVIL) 100 mg tablet Take two tablets by mouth at bedtime daily.    baclofen (LIORESAL) 20 mg tablet Take one tablet by mouth at bedtime daily.    ergocalciferol (vitamin D2) (VITAMIN D PO) Take  by mouth daily.    fexofenadine(+) (ALLEGRA) 180 mg tablet Take one tablet by mouth daily.    gabapentin (NEURONTIN) 800 mg tablet Take one tablet by mouth every 8 hours. (Patient taking differently: Take one tablet by mouth twice daily.)    magnesium oxide 400 mg magnesium tab Take 1 tablet by mouth daily.    omeprazole DR (PRILOSEC) 40 mg capsule TAKE 1 CAPSULE BY MOUTH TWICE DAILY 30 MINUTES TO 1 HOUR PRIOR TO FIRST AND LAST MEAL OF THE DAY    propranoloL (INDERAL) 10 mg tablet Take one tablet by mouth at bedtime daily.    riboflavin (vitamin B2) 400 mg tab Take one tablet by mouth daily.    rimegepant (NURTEC ODT) 75 mg rapid dissolve tablet Dissolve one tablet by mouth every 48 hours. Place on or under the tongue. Max 75 mg in 24 hours, and 18 doses in 30 days.    rizatriptan (MAXALT-MLT) 10 mg rapid dissolve tablet Take one tablet by mouth at  onset of headache. May repeat after 2 hours. Max of 30 mg in 24 hours.     There were no vitals filed for this visit.  There is no height or weight on file to calculate BMI.     Physical Exam  Alert and oriented x 4, affect and mood normal  Cranial nerves: 2 through 12 intact  Speech: Unremarkable  Motor: moving upper extremities equally  Coordination: No noted dysmetria with upper limb movements.       Assessment and Plan:    Problem   Intractable Chronic Migraine Without Aura and With Status Migrainosus    She will continue Nurtec 75 mg ODT tablets every other day as preventive therapy.  She will continue low-dose propranolol 10 mg nightly and gabapentin 800 mg twice a day.  She has been on amitriptyline 200 mg nightly for multiple years and is tolerating the medicine and will continue this as well.  I am going to try to get a prescription again for rizatriptan as an abortive agent as the Nurtec is being used for preventative therapy.  She will also look into the Relivion device to see if this would help the migraine as well.        Total Time Today was 26 minutes in the following activities: Preparing to see the patient, Performing a medically appropriate examination and/or evaluation, and Documenting clinical information in the electronic or other health record

## 2023-10-07 ENCOUNTER — Encounter: Admit: 2023-10-07 | Discharge: 2023-10-07 | Payer: PRIVATE HEALTH INSURANCE

## 2023-10-11 ENCOUNTER — Encounter: Admit: 2023-10-11 | Discharge: 2023-10-11 | Payer: PRIVATE HEALTH INSURANCE

## 2023-10-13 ENCOUNTER — Encounter: Admit: 2023-10-13 | Discharge: 2023-10-13 | Payer: PRIVATE HEALTH INSURANCE

## 2023-10-13 DIAGNOSIS — R278 Other lack of coordination: Secondary | ICD-10-CM

## 2023-10-13 NOTE — Telephone Encounter
-----   Message from CHRISTELLA Blanch, MD sent at 10/13/2023  2:15 PM CDT -----  ARM shows pelvic floor dyssynergia.    Please notify patient and send referral to pelvic floor physical therapy.  ----- Message -----  From: Chinita Sham, RN  Sent: 09/29/2023   5:21 PM CDT  To: Valdemar KATHEE Blanch, MD

## 2023-10-25 ENCOUNTER — Ambulatory Visit: Admit: 2023-10-25 | Discharge: 2023-10-26 | Payer: PRIVATE HEALTH INSURANCE

## 2023-10-25 ENCOUNTER — Encounter: Admit: 2023-10-25 | Discharge: 2023-10-25 | Payer: PRIVATE HEALTH INSURANCE

## 2023-10-25 ENCOUNTER — Ambulatory Visit: Admit: 2023-10-25 | Discharge: 2023-10-25 | Payer: PRIVATE HEALTH INSURANCE

## 2023-10-26 ENCOUNTER — Encounter: Admit: 2023-10-26 | Discharge: 2023-10-26 | Payer: PRIVATE HEALTH INSURANCE

## 2023-11-10 ENCOUNTER — Encounter: Admit: 2023-11-10 | Discharge: 2023-11-10 | Payer: PRIVATE HEALTH INSURANCE

## 2023-12-06 ENCOUNTER — Encounter: Admit: 2023-12-06 | Discharge: 2023-12-06 | Payer: PRIVATE HEALTH INSURANCE

## 2023-12-09 ENCOUNTER — Encounter: Admit: 2023-12-09 | Discharge: 2023-12-09 | Payer: PRIVATE HEALTH INSURANCE

## 2023-12-09 NOTE — Progress Notes
 Received request for response from patient's workers Energy manager, Eleanor Portugal with questions regarding recommendation of prism  correction from Dr. Ruthellen. Response to claims examiner: Please review Dr. Starlett exam notes. He did not recommend prism  correction. Patient reported prior use of prism  readers, prescribed elsewhere. Following her exam she reached out regarding her interest in receiving therapy from [facility], stating she had spoken with them about evaluation for prism  and was open to receiving/discussing options for vision therapy. At her request, a referral was placed, and it was included on the referral that she had previously used prism  glasses that have since been misplaced.

## 2023-12-27 ENCOUNTER — Encounter: Admit: 2023-12-27 | Discharge: 2023-12-27 | Payer: PRIVATE HEALTH INSURANCE

## 2023-12-27 MED ORDER — PROPRANOLOL 10 MG PO TAB
10 mg | ORAL_TABLET | Freq: Every evening | ORAL | 3 refills | 30.00000 days | Status: AC
Start: 2023-12-27 — End: ?

## 2023-12-29 ENCOUNTER — Encounter: Admit: 2023-12-29 | Discharge: 2023-12-29 | Payer: PRIVATE HEALTH INSURANCE

## 2024-01-10 ENCOUNTER — Encounter: Admit: 2024-01-10 | Discharge: 2024-01-10 | Payer: PRIVATE HEALTH INSURANCE

## 2024-01-10 MED ORDER — RIMEGEPANT 75 MG PO TBDI
75 mg | ORAL_TABLET | ORAL | 11 refills | Status: CN
Start: 2024-01-10 — End: ?

## 2024-01-10 MED ORDER — RIMEGEPANT 75 MG PO TBDI
75 mg | ORAL_TABLET | ORAL | 11 refills | 30.00000 days | Status: DC
Start: 2024-01-10 — End: 2024-01-10

## 2024-01-11 ENCOUNTER — Encounter: Admit: 2024-01-11 | Discharge: 2024-01-11 | Payer: PRIVATE HEALTH INSURANCE

## 2024-01-12 ENCOUNTER — Encounter: Admit: 2024-01-12 | Discharge: 2024-01-12 | Payer: PRIVATE HEALTH INSURANCE

## 2024-01-12 NOTE — Progress Notes
 Pharmacy Benefits Investigation    Medication name: rimegepant (NURTEC ODT ) 75 mg rapid dissolve tablet  Medication status: new    The insurance requires a prior authorization for the medication. The prior authorization was submitted via CoverMyMeds. Will provide update when available from payor or within 4 business days.    PA number: [id number]      Penny Townsend  Specialty Pharmacy Patient Advocate

## 2024-01-16 ENCOUNTER — Encounter: Admit: 2024-01-16 | Discharge: 2024-01-16 | Payer: PRIVATE HEALTH INSURANCE

## 2024-01-17 ENCOUNTER — Encounter: Admit: 2024-01-17 | Discharge: 2024-01-17 | Payer: PRIVATE HEALTH INSURANCE

## 2024-01-17 NOTE — Progress Notes
 Pharmacy Benefits Investigation    Medication name: rimegepant (NURTEC ODT ) 75 mg rapid dissolve tablet  Medication status: new    The insurance requires a prior authorization for the medication. The prior authorization was submitted via telephone. Will provide update when available from payor or within 4 business days.    PA number: [id number]    Previous submission not found. Completed via phone and faxed chart notes. Requested expedited processing.      Harlene Hummer  Specialty Pharmacy Patient Advocate

## 2024-01-19 ENCOUNTER — Encounter: Admit: 2024-01-19 | Discharge: 2024-01-19 | Payer: PRIVATE HEALTH INSURANCE

## 2024-01-30 ENCOUNTER — Encounter: Admit: 2024-01-30 | Discharge: 2024-01-30 | Payer: PRIVATE HEALTH INSURANCE

## 2024-02-10 ENCOUNTER — Encounter: Admit: 2024-02-10 | Discharge: 2024-02-10 | Payer: PRIVATE HEALTH INSURANCE

## 2024-02-14 ENCOUNTER — Encounter: Admit: 2024-02-14 | Discharge: 2024-02-14 | Payer: PRIVATE HEALTH INSURANCE

## 2024-02-27 ENCOUNTER — Encounter: Admit: 2024-02-27 | Discharge: 2024-02-27 | Payer: PRIVATE HEALTH INSURANCE

## 2024-02-27 ENCOUNTER — Ambulatory Visit: Admit: 2024-02-27 | Discharge: 2024-02-27 | Payer: PRIVATE HEALTH INSURANCE

## 2024-02-27 DIAGNOSIS — G43911 Migraine, unspecified, intractable, with status migrainosus: Principal | ICD-10-CM

## 2024-02-27 NOTE — Progress Notes [1]
 Telehealth Visit Note    Date of Service: 02/27/2024    Subjective:           Penny Townsend is a 58 y.o. female.    Patient presents to clinic for intractable migraine and abdominal pain.  She reports the migraine started 7 days ago.  It had started with eye pain and headache.  It has continued since that time.  She reports typically her migraine will last about 2 to 3 days and she is able to use her prescriptions to help it resolve.  This time she is unable to get relief from her Maxalt , Nurtec.  She has been having nausea today and did take a Zofran.  This was helpful.    Patient also reports some abdominal discomfort.  She reports that since January she has lost 30 pounds unintentionally.  She has been dealing with stomach bloating and discomfort.  She had an array of tests and labs and was told that it is likely caused by constipation.  Patient treats with Colace.  She reports this helps but does not resolve the abdominal pain.  This has not changed with the migraine but is still ongoing.    Headache  Abdominal pain  Associated symptoms include headaches.                    Objective:          acetaminophen SR (TYLENOL) 650 mg tablet Take one tablet by mouth as Needed for Pain.    amitriptyline  (ELAVIL ) 100 mg tablet Take two tablets by mouth at bedtime daily.    baclofen  (LIORESAL ) 20 mg tablet TAKE 1 TABLET BY MOUTH EVERY DAY AT BEDTIME    ergocalciferol (vitamin D2) (VITAMIN D PO) Take  by mouth daily.    fexofenadine(+) (ALLEGRA) 180 mg tablet Take one tablet by mouth daily.    gabapentin  (NEURONTIN ) 800 mg tablet Take one tablet by mouth every 8 hours. (Patient taking differently: Take one tablet by mouth twice daily.)    linaCLOtide (LINZESS) 290 mcg capsule Take one capsule by mouth daily.    magnesium  oxide 400 mg magnesium  tab Take 1 tablet by mouth daily.    omeprazole  DR (PRILOSEC) 40 mg capsule TAKE 1 CAPSULE BY MOUTH TWICE DAILY 30 MINUTES TO 1 HOUR PRIOR TO FIRST AND LAST MEAL OF THE DAY propranoloL  (INDERAL ) 10 mg tablet Take one tablet by mouth at bedtime daily.    riboflavin  (vitamin B2) 400 mg tab Take one tablet by mouth daily.    rimegepant (NURTEC ODT ) 75 mg rapid dissolve tablet Dissolve one tablet by mouth every 48 hours. Place on or under the tongue. Max 75 mg in 24 hours, and 18 doses in 30 days.    rizatriptan  (MAXALT -MLT) 10 mg rapid dissolve tablet Take one tablet by mouth at onset of headache. May repeat after 2 hours. Max of 30 mg in 24 hours.           Telehealth Body Mass Index: [phone] at 02/27/2024  3:27 PM    Physical Exam  Constitutional:       General: She is not in acute distress.     Appearance: Normal appearance. She is normal weight. She is not ill-appearing, toxic-appearing or diaphoretic.   HENT:      Head: Normocephalic and atraumatic.   Pulmonary:      Effort: Pulmonary effort is normal.   Neurological:      Mental Status: She is alert and oriented to  person, place, and time.   Psychiatric:         Mood and Affect: Mood normal.         Behavior: Behavior normal.         Thought Content: Thought content normal.         Judgment: Judgment normal.       Due to patient trying oral medications from home with no migraine relief, it is recommended that she be seen in person.  Advised patient to present to an urgent care for vital signs and evaluation.  Advised patient she would benefit from an IV bolus and Toradol shot if available.  Patient agrees and will plan to visit urgent care.  She has a friend who can drive her.       Assessment and Plan:  Penny Townsend was seen today for headache and abdominal pain.    Diagnoses and all orders for this visit:    Intractable migraine with status migrainosus, unspecified migraine type      Patient Instructions   Please present to urgent care for in person evaluation.  You need vital signs and a possible Toradol injection and/or fluids                           14 minutes spent on this patient's encounter with counseling and coordination of care taking >50% of the visit.

## 2024-02-27 NOTE — Patient Instructions [37]
 Please present to urgent care for in person evaluation.  You need vital signs and a possible Toradol injection and/or fluids

## 2024-03-14 ENCOUNTER — Encounter: Admit: 2024-03-14 | Discharge: 2024-03-14 | Payer: PRIVATE HEALTH INSURANCE

## 2024-03-14 MED ORDER — BACLOFEN 20 MG PO TAB
20 mg | ORAL_TABLET | Freq: Every evening | ORAL | 0 refills | 30.00000 days | Status: AC
Start: 2024-03-14 — End: ?

## 2024-03-26 ENCOUNTER — Encounter: Admit: 2024-03-26 | Discharge: 2024-03-26 | Payer: PRIVATE HEALTH INSURANCE

## 2024-03-26 DIAGNOSIS — G43009 Migraine without aura, not intractable, without status migrainosus: Principal | ICD-10-CM

## 2024-03-26 MED ORDER — AMITRIPTYLINE 100 MG PO TAB
200 mg | ORAL_TABLET | Freq: Every evening | ORAL | 0 refills | 30.00000 days | Status: AC
Start: 2024-03-26 — End: ?

## 2024-04-05 ENCOUNTER — Encounter: Admit: 2024-04-05 | Discharge: 2024-04-05 | Payer: PRIVATE HEALTH INSURANCE

## 2024-04-06 ENCOUNTER — Encounter: Admit: 2024-04-06 | Discharge: 2024-04-06 | Payer: PRIVATE HEALTH INSURANCE

## 2024-04-06 ENCOUNTER — Ambulatory Visit: Admit: 2024-04-06 | Discharge: 2024-04-07 | Payer: PRIVATE HEALTH INSURANCE

## 2024-04-06 DIAGNOSIS — G43711 Chronic migraine without aura, intractable, with status migrainosus: Principal | ICD-10-CM

## 2024-04-06 MED ORDER — GABAPENTIN 800 MG PO TAB
800 mg | ORAL_TABLET | Freq: Two times a day (BID) | ORAL | 5 refills | 30.00000 days | Status: AC
Start: 2024-04-06 — End: ?

## 2024-04-06 NOTE — Progress Notes [1]
 Date of Service: 04/06/2024    Subjective:             Penny Townsend is a 58 y.o. female who is being seen by telehealth visit (real time video and audio communication being utilized) due to a history of migraine headaches.        History of Present Illness  Penny Townsend is a very pleasant 58 year old right-hand-dominant female who is being seen by telehealth visit (real time video and audio communication utilized) in follow up  regarding a history of headaches.  She has had migraine headaches since  high school but states she really began having trouble in 1997.  At that time, she was working on her masters in physical therapy and was trying to lift a patient and ripped her supraspinatus muscle and had to have surgery and after that had some nerve injury to the neck.  She states that since 1997 she has been having very frequent migrainous headaches and over the last multiple years she has had headaches on a daily basis.  She states that she has headaches that typically start in the occipital area of her head and will radiate to the crown of the head with a stabbing sharp pain with pressure and pounding associated with dizziness nausea and light and sound sensitivity.  She states that on a daily basis she has a moderate level headache that starts as a 4 when she wakes up but by the end of the day is a 6-7/10 on a 0-10 pain scale.  She states that at least twice a month she will have a severe headache that is a 9-10/10 on a 0-10 pain scale and states it feels as if her eyes are vibrating and her brain is vibrating and she has severe nausea and vomiting.  She does note that bright lights/for out flashing lights loud noises physical exertion and massaging around her head and neck can increase her headaches.  She does have to lie down when the headaches are more severe.  She states that when the more severe headaches hit she will feel increased pressure in her temples and increasing nausea and knows that she is going to have a bad headache.  She is currently taking Nurtec 75 mg every other day and does state that this is helping with the migraines.  She states that she is not having them throughout the entire day any longer but will have a migraine that will start towards the late afternoon or early evening.     She has been on multiple medications and is currently still taking amitriptyline  200 mg nightly which she has been on for more than 10 years which helps her to a degree with the pain intensity.   She has tried sumatriptan as an abortive medicine without benefit.  She has been on Topamax  in the past without benefit.   She has been continued on propranolol  10 mg p.o. and tried to titrate up to 20mg  bid but had severe dizziness and has reduced to 10mg  nighlty and states she does feel it helps with the throbbing when she lies down.  She  continues to take gabapentin  800 mg twice daily which may give some benefit in the overall pain severity on the day-to-day basis.  She states she has tried taking gabapentin  at higher dosing but has dizziness with the medication at higher doses.  She has tried Aimovig  which caused constipation but was helpful, she tried [employer]  which did not seem to give her  much benefit she has used diclofenac  as an abortive agent in the recent past and states that she has been on rizatriptan  oral tablets which do help her but she has been unable to get them since she has been on Nurtec.  That the Nurtec 75 mg every other day dose has helped her reduce her headaches to about once per day which is less intense.  She states that she is not dealing with migraine and tired day.   She has also been given a prescription for rizatriptan  MLT but states that the insurance will not pay for both rizatriptan  and Nurtec even though the Nurtec is being used as a preventative and rizatriptan  as an abortive therapy.  She had prior to that been on Qulipta 10 mg daily and states that she did not feel it was helping her.   She did  Botox  injections for a year and initially felt it was helpful but then the benefit seem to wear off but this was well over 10 years ago and she has never tried it in combination with the CGRP blockers.  She did have a series of Botox  injections in July 2024 but states that she was unable to continue the Botox  and does not remember why.  She had only had 1 round of the Botox  in July and is uncertain if it gave her benefit but likely she was not getting insurance coverage for the therapy.  She has tried turmeric for the migraines as well and is trying to do home exercises/stretching to see if this will help with the migraines.  She has been taking propranolol  10 mg nightly and has been having complaints of dizziness which she does not correlate with propranolol  use.  She has had MRI/MRA head ordered but this is not scheduled until February 2026.  She states that she really has not had any change in her overall migraines frequency.  She has no other complaints on the exam today.          Objective:        Objective    acetaminophen SR (TYLENOL) 650 mg tablet Take one tablet by mouth as Needed for Pain.    amitriptyline  (ELAVIL ) 100 mg tablet TAKE 2 TABLETS BY MOUTH EVERY DAY AT BEDTIME    baclofen  (LIORESAL ) 20 mg tablet TAKE 1 TABLET BY MOUTH ONCE DAILY AT BEDTIME    ergocalciferol (vitamin D2) (VITAMIN D PO) Take  by mouth daily.    fexofenadine(+) (ALLEGRA) 180 mg tablet Take one tablet by mouth daily.    gabapentin  (NEURONTIN ) 800 mg tablet Take one tablet by mouth every 8 hours. (Patient taking differently: Take one tablet by mouth twice daily.)    linaCLOtide (LINZESS) 290 mcg capsule Take one capsule by mouth daily.    magnesium  oxide 400 mg magnesium  tab Take 1 tablet by mouth daily.    omeprazole  DR (PRILOSEC) 40 mg capsule TAKE 1 CAPSULE BY MOUTH TWICE DAILY 30 MINUTES TO 1 HOUR PRIOR TO FIRST AND LAST MEAL OF THE DAY    propranoloL  (INDERAL ) 10 mg tablet Take one tablet by mouth at bedtime daily. riboflavin  (vitamin B2) 400 mg tab Take one tablet by mouth daily.    rimegepant (NURTEC ODT ) 75 mg rapid dissolve tablet Dissolve one tablet by mouth every 48 hours. Place on or under the tongue. Max 75 mg in 24 hours, and 18 doses in 30 days.    rizatriptan  (MAXALT -MLT) 10 mg rapid dissolve tablet Take one tablet by mouth  at onset of headache. May repeat after 2 hours. Max of 30 mg in 24 hours.     There were no vitals filed for this visit.  There is no height or weight on file to calculate BMI.   Physical Exam   Alert and oriented x 4, affect and mood normal  Cranial nerves: 2 through 12 intact  Speech: Unremarkable  Motor: moving upper extremities equally  Coordination: No noted dysmetria with upper limb movements.          Assessment and Plan:    Problem   Intractable Chronic Migraine Without Aura and With Status Migrainosus    She will continue Nurtec 75 mg ODT tablets every other day as preventive therapy.  She will continue low-dose propranolol  10 mg nightly and gabapentin  800 mg twice a day.  She has been on amitriptyline  200 mg nightly for multiple years and is tolerating the medicine and will continue this as well.  I am going to try to get a prescription again for rizatriptan  as an abortive agent as the Nurtec is being used for preventative therapy.  She will try to increase propranolol  to 10 mg 3 times daily with plans to titrate if needed and tolerated.  If she has worsening dizziness she is to taper off the medication.  She will also look into the Relivion device to see if this would help the migraine as well.        Total Time Today was 18 minutes in the following activities: Preparing to see the patient, Performing a medically appropriate examination and/or evaluation, and Documenting clinical information in the electronic or other health record

## 2024-04-10 ENCOUNTER — Encounter: Admit: 2024-04-10 | Discharge: 2024-04-10 | Payer: PRIVATE HEALTH INSURANCE

## 2024-04-10 ENCOUNTER — Ambulatory Visit: Admit: 2024-04-10 | Discharge: 2024-04-11 | Payer: PRIVATE HEALTH INSURANCE

## 2024-04-10 VITALS — BP 119/73 | HR 105 | Temp 97.90000°F | Resp 16 | Ht 64.0 in | Wt 128.3 lb

## 2024-04-10 DIAGNOSIS — F5104 Psychophysiologic insomnia: Secondary | ICD-10-CM

## 2024-04-10 NOTE — Assessment & Plan Note [38]
 Using aPAP @10 -15 cm H2O, reviewed CPAP download.  30/30 days of usage.  Wearing 4 hours or more for 93% nights.  Average use 9 hrs 3 minutes/night  Residual AHI 0.7  The 95th percentile pressure was 11.6 cmH20, with a maximum of 12.9 cmH20, and a median of 10.5 cmH20.  The median leak was 1.5 L/min     Patient is well controlled on these settings and experiences symptomatic benefit.   Due for a new machine, DME order placed and she will RTC for a compliance visit in 3 months.  She was advised to use CPAP during any sleep periods.   Provided education/educational materials on CPAP hygiene and need for follow-up visits.

## 2024-04-10 NOTE — Progress Notes [1]
 Confirmed with patient her previous last name is Roesch.

## 2024-04-10 NOTE — Progress Notes [1]
 Date of Service: 04/10/2024    Ward, Montie PARAS, DO  [address]  [place],  [place] [address]    Dear Dr. Neomi,            Powell Shuck Selma Kelby Herbert is a very pleasant 58 y.o. female presenting to [facility] clinic for initial evaluation of Sleep Problem.      Subjective:  History of Present Illness  cervical spondylosis, asthma, migraines, GERD and OSA (on CPAP).    Sleep study HST 01/14/2015:  AHI of 9.5, supine 9.6, nonsupine 2.7  SpO2 <90% 5 minutes, nadir 80%    She was started on CPAP by Dr. Will Roe(neurologist) at the time and has been on CPAP since.  She saw dr. Gohar from sleep medicine at [facility] center on 01/04/2024, he recommended a home sleep study to reassess apnea as her current CPAP is due for replacement. Her current CPAP is 60 years old.  She wants to transfer care to [facility].    2 night HSAT 02/03/2024 & 02/05/2024:  02/03/2024: REI 22.1, <88% 1.4 minutes  02/05/2024:  REI 31.4, <88% 0.9 minutes    She feels benefit from CPAP, feels more rested upon awakening, less morning headaches and doesn't snore with it.  She uses a nasal cushion mask with a chinstrap.  Denies issues with CPAP mask, pressure settings.  Denies any dry mouth or aerophagia.  Her current machine is giving her a message that it is due for replacement.  She is currently getting her supplies online.    She has tried ambien for insomnia many years ago around 2010, but had to stop it due to complex sleep behaviors/sleep walking.    Bedtime: 10-10:30 pm. Has trouble falling sleep at night.   SOL: 1.5 to 3 hours. Doesn't feel sleepy, has an overactive mind and also has headaches. She uses an ice pack for the headache, listens to an audiobook which helps her relax. These measures help her relax and sleep.  Wake up: 9:30 am. Feels tired in the morning.   Arousals: none usually  Stimulants/Sedatives: No caffeine. She takes amitriptyline  200 mg, gabapentin  800 mg and baclofen  20 mg at bedtime for headaches. They help her ease the headaches and sleep easier.   Daytime naps/dozing: No regular naps during daytime. No drowsy driving.  Bed partner: none  Position: supine    Sleep ROS: no snoring with CPAP, no choking/gasping spells, no dry mouth, no restless legs, no sleep related hallucinations, no dream enactments, no sleep paralysis.   She has sleep walked, more when she was a child and even in adult life. No episodes since the past 10 years, till a few weeks ago she says she sleep walked, she thinks she may have sleep walked and let her cat outside the room.    Denies any smoking, alcohol use, recreational substances.  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        04/10/2024     3:24 PM   Sleep New Patient Intake   Reason for visit:  Have primary sleep complaint    Do you have a sleep complaint? Yes    If yes, describe your sleep complaint. Let    If yes, what month and year was the last sleep study you had? It takes me several hours to fall asleep.    Are you currently using any sort of treatment for your sleep condition? Yes    If yes, select all that apply:  Positive airway pressure device (CPAP, BiPAP)    Do you work a night, evening, split, rotating or variable schedule shift? No    During the past month, at what time do you usually go to bed? 930    Once in bed, how long does it take you to fall asleep? 1 1/2 to 3 hrs    Once asleep, how many times do you wake up during the night? 1    How long does it take for you to fall back to sleep? 15    During the past month, at what time do you usually wake up? 930    Considering only the time that you spend asleep, how many hours of sleep do you get? 7    Please select any sleep aids used at bedtime: Other    If sleep aid(s) selected, describe:  Audiobook    Do you consume alcohol at bedtime? No    Do you consume caffeinated beverages up to 6 hours prior to bedtime? No    Do you use marijuana at bedtime? No    Do you feel a strong, often uncontrollable urge to move your legs, accompanied with unpleasant or uncomfortable sensation in your legs or feet? No (continue to next section)    Sudden muscle weakness in response to emotions such as laughter, anger or surprise Never    Inability to move while falling asleep or when waking up Never    Hallucinations or dreamlike images when falling asleep or waking up Never    Dream during daytime naps Never    Act out your dreams while asleep Never    Nightmares Sometimes    Talk in your sleep Rarely    Sleep walking as an adult Rarely    Heartburn or acid reflux at night Sometimes    Chronic pain interferes with your sleep Frequently    Wake up to urinate at night Never    Grind your teeth in your sleep Rarely    Bedwetting (enuresis) Never    Leg kicks Never    Morning headaches Frequently    In the past month, my overall sleep quality was:  Poor    In the last month:My sleep was restless. A little bit    In the last month:I felt lousy when I woke up.. Very much    In the last month:I had difficulty falling asleep. Very much    In the last month:I woke up too early and could not fall back asleep. Not at all    In the last month:I had trouble staying asleep. A little bit    In the last month:I had difficulty waking up. Quite a bit    In the last month:Coke AMB R SLEEP NEW PT FELT TIRED Very much    In the last month:I was sleepy during the daytime. A little bit    In the last month:I had a hard time concentrating because of poor sleep. Quite a bit    In the last month:I felt irritable because of poor sleep. Somewhat    During a usual week, how many times do you nap for 5 minutes or more? 0    What is the chance that you would doze off or fall asleep (not just ?feel tired?) while driving? Moderate chance        Patient-reported          04/10/2024     3:30 PM   STOP-BANG   Do you snore  loudly? 1    Do you often feel tired or fatigued after your sleep? 1    Has anyone ever observed you stop breathing in your sleep? 1    Do you have or are you being treated for high blood pressure? 0    Recent BMI (Calculated) 0    Is BMI greater than 35 kg/m2? 0    Age older than 60 years old? 1    Is your neck circumference greater than 17 inches (Female) or 16 inches (Female)? 0    Gender - Female 1    STOP-BANG Score 5        Patient-reported        ESS 04/10/2024:       04/10/2024     3:28 PM   Epworth Sleepiness Scale   Sitting and reading 0    Watching TV 1    Sitting inactive in a public place (e.g. a theater or a meeting) 0    As a passenger in a car for an hour without a break 2    Lying down to rest in the afternoon when circumstances permit 1    Sitting and talking to someone 0    Sitting quietly after a lunch without alcohol 1    In a car, while stopped for a few minutes in traffic 0    Epworth Sleepiness Scale Score 5        Patient-reported           Review of Systems  Constitutional: Negative for fatigue and unexpected weight change.   HENT: Negative for congestion, sore throat and trouble swallowing.    Respiratory: Negative for cough, choking, shortness of breath and wheezing.    Neurological: Negative for dizziness and seizures.   Psychiatric/Behavioral: Negative for hallucinations. The patient is not nervous/anxious.    All other systems reviewed and are negative.      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, left  2021-04: Concussion without loss of consciousness  2021-04: Cervicalgia  2019-03: Gastritis  2019-03: Elevated antinuclear antibody (ANA) level  2019-03: Abdominal distention  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  2019-01: Bilious vomiting with nausea  2019-01: LUQ abdominal pain  2019-01: Gas bloat syndrome  2019-01: Other dysphagia  2019-01: Gastroesophageal reflux disease without esophagitis  2018-12: Chest tightness or pressure  2018-01: Chest pain  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  2017-12: HAP (hospital-acquired pneumonia)  2017-12: Generalized edema  2017-12: Normocytic anemia  2017-12: Thrombocytosis  2017-12: Bilateral pleural effusion  2017-12: Constipation  2017-12: Pericardial effusion  2017-12: SIRS (systemic inflammatory response syndrome) (CMS-HCC)    Surgical History:   Procedure Laterality Date    THORACENTESIS N/A 04/15/2016    Performed by Braulio Duncans, MD at [facility] ENDO    BREATH HYDROGEN/ methane testing to rule out bacterial overgrowth  N/A 05/24/2017    Performed by Bonnetta Locus, MD at [facility] ENDO    ANORECTAL MANOMETRY N/A 09/27/2023    Performed by Ledora Catalina, MD at [facility] ENDO    COLONOSCOPY DIAGNOSTIC WITH SPECIMEN COLLECTION BY BRUSHING/ WASHING - FLEXIBLE N/A 09/30/2023    Performed by Tobie Valdemar NOVAK, MD at [facility] OR    ESOPHAGOGASTRODUODENOSCOPY WITH BIOPSY - FLEXIBLE N/A 09/30/2023    Performed by Tobie Valdemar NOVAK, MD at [facility] OR    CESAREAN SECTION      HX CESAREAN SECTION  2000    HX ENDOSCOPY  5 years ago?    ROTATOR CUFF REPAIR       Family History   Problem Relation Name Age of Onset    Heart problem Father      Cancer Father          liver    Heart Attack Father      Thyroid Disease Father      Diabetes Father      GI Cancer Father          liver CA    Cancer Mother          pancreatic    Thyroid Disease Mother      Diabetes Mother      GI Cancer Mother          pancreatic CA    Colon Polyps Mother      Other Sister          GI issues    Other Brother          GI issues    Melanoma Brother      Other Brother          GI issues    Celiac Disease Neg Hx      Cancer-Colon Neg Hx      Inflammatory Bowel Disease Neg Hx      Rectal Cancer Neg Hx      Ulcerative Colitis Neg Hx      Liver Disease Father Gaither         Died of Liver Cancer May 04, 2009    Migraines Maternal Grandmother Migraines     Asthma Brother       Social History[1]      Objective:          acetaminophen SR (TYLENOL) 650 mg tablet Take one tablet by mouth as Needed for Pain.    amitriptyline  (ELAVIL ) 100 mg tablet TAKE 2 TABLETS BY MOUTH EVERY DAY AT BEDTIME    baclofen  (LIORESAL ) 20 mg tablet TAKE 1 TABLET BY MOUTH ONCE DAILY AT BEDTIME    ergocalciferol (vitamin D2) (VITAMIN D PO) Take  by mouth daily.    fexofenadine(+) (ALLEGRA) 180 mg tablet Take one tablet by mouth daily.    gabapentin  (NEURONTIN ) 800 mg tablet Take one tablet by mouth twice daily.    linaCLOtide (LINZESS) 290 mcg capsule Take one capsule by mouth daily.    magnesium  oxide 400 mg magnesium  tab Take 1 tablet by mouth daily.    omeprazole  DR (PRILOSEC) 40 mg capsule TAKE 1 CAPSULE BY MOUTH TWICE DAILY 30 MINUTES TO 1 HOUR PRIOR TO FIRST AND LAST MEAL OF THE DAY    propranoloL  (INDERAL ) 10 mg tablet Take one tablet by mouth at bedtime daily.    riboflavin  (vitamin B2) 400 mg tab Take one tablet by mouth daily.    rimegepant (NURTEC ODT ) 75 mg rapid dissolve tablet Dissolve one tablet by mouth  every 48 hours. Place on or under the tongue. Max 75 mg in 24 hours, and 18 doses in 30 days.    rizatriptan  (MAXALT -MLT) 10 mg rapid dissolve tablet Take one tablet by mouth at onset of headache. May repeat after 2 hours. Max of 30 mg in 24 hours.     Vitals:    04/10/24 1525   BP: 119/73   BP Source: Arm, Right Upper   Pulse: 105   Temp: 36.6 ?C (97.9 ?F)   Resp: 16   SpO2: 98%   TempSrc: Oral   PainSc: Zero   Weight: 58.2 kg (128 lb 4.8 oz)   Height: 162.6 cm (5' 4)     Body mass index is 22.02 kg/m?SABRA     Physical Exam  Vitals reviewed.   Constitutional:       Appearance: Normal appearance.   HENT:      Head: Atraumatic.      Nose: Nose normal. No congestion.      Mouth/Throat:      Mouth: Mucous membranes are moist.      Pharynx: Oropharynx is clear.      Comments: Mallampati 3, crowded airway, normal tonsillar size  Eyes:      Extraocular Movements: Extraocular movements intact.      Conjunctiva/sclera: Conjunctivae normal.   Cardiovascular:      Rate and Rhythm: Normal rate and regular rhythm.   Pulmonary:      Effort: Pulmonary effort is normal.      Breath sounds: Normal breath sounds.   Skin:     General: Skin is warm and dry.   Neurological:      Mental Status: He is alert and oriented to person, place, and time. Mental status is at baseline.   Psychiatric:         Behavior: Behavior normal.         Thought Content: Thought content normal.        Assessment and Plan:  Problem List       Obstructive sleep apnea    Overview   Sleep study HST 01/14/2015:  AHI of 9.5, supine 9.6, nonsupine 2.7  SpO2 <90% 5 minutes, nadir 80%    She was started on CPAP by Dr. Will Roe(neurologist) at the time and has been on CPAP since.  She saw dr. Gohar from sleep medicine at [facility] center on 01/04/2024, he recommended a home sleep study to reassess apnea as her current CPAP is due for replacement. Her current CPAP is 60 years old.    2 night HSAT 02/03/2024 & 02/05/2024:  02/03/2024: REI 22.1, <88% 1.4 minutes  02/05/2024:  REI 31.4, <88% 0.9 minutes         Current Assessment & Plan     Using aPAP @10 -15 cm H2O, reviewed CPAP download.  30/30 days of usage.  Wearing 4 hours or more for 93% nights.  Average use 9 hrs 3 minutes/night  Residual AHI 0.7  The 95th percentile pressure was 11.6 cmH20, with a maximum of 12.9 cmH20, and a median of 10.5 cmH20.  The median leak was 1.5 L/min     Patient is well controlled on these settings and experiences symptomatic benefit.   Due for a new machine, DME order placed and she will RTC for a compliance visit in 3 months.  She was advised to use CPAP during any sleep periods.   Provided education/educational materials on CPAP hygiene and need for follow-up visits.  Chronic insomnia    Current Assessment & Plan   Primarily sleep onset issues.  Multifactorial with psychophysiological component plus inadequate sleep hygiene and chronic migraines contributing to her trouble falling asleep.  Discussed with patient that cognitive behavioral therapy for insomnia (CBT-I) is the most effective treatment for chronic insomnia. Provided her with community/telehealth and online resources in [place].  Also discussed sleep restriction, limiting time in bed, and considering setting an earlier wake time around 7 a.m. She is hesitant to do this since it makes her migraines worse and her feel more sleepy in the daytime. She will instead try setting a later bedtime, around 11:00-11:30 p.m. to consolidate her sleep schedule.  Discussed pharmacologic treatment options, including the risk of next-day sedation, frequent falls and long-term tolerance.  Medication options discussed. Will hold of on sleep aids for now since she is on a significant dose of amitriptyline , baclofen  and gabapentin  at bedtime and has had complex sleep behaviors with ambien in the past.  She will continue to follow up with the neurology clinic for management of migraines.            Patient was also counseled on good sleep hygiene, non-supine sleep, avoidance of alcohol before bedtime, regular exercise as tolerated and weight loss to target body weight. Cautioned on the risks of operating motor vehicle or any heavy machinery when sleepy and advised against drowsy driving.   Ample opportunity was provided for questions and concerns. Patient is aware they can contact our office via phone or mychart for any further queries.        Thank you for the opportunity to participate in the care of this patient. We will continue to follow them in Sleep Medicine clinic. Return in about 3 months (around 07/09/2024).    Sherle Mello, MB BS  PGY-4, Sleep Medicine Fellow  [facility]    Orders Placed This Encounter    AMB REFERRAL TO HOME CARE Total Time Today was 40 minutes in the following activities: Preparing to see the patient, Obtaining and/or reviewing separately obtained history, Performing a medically appropriate examination and/or evaluation, Counseling and educating the patient/family/caregiver, Documenting clinical information in the electronic or other health record, and communicating results to the patient/family/caregiver and Care coordination.    Seen and discussed with Dr. Darlynn Gravely, MD       [1]   Social History  Socioeconomic History    Marital status: Separated   Occupational History    Occupation: educator   Tobacco Use    Smoking status: Never    Smokeless tobacco: Never   Substance and Sexual Activity    Alcohol use: Not Currently     Comment: RARELY    Drug use: Never    Sexual activity: Not Currently     Partners: Male     Birth control/protection: Post-menopausal   Social History Narrative    Lives with husband and 2 kids (adopted 2 kids). Has 1 biologic daughter. No DV.  Works at [employer].  Volunteers at [employer]

## 2024-04-11 ENCOUNTER — Encounter: Admit: 2024-04-11 | Discharge: 2024-04-11 | Payer: PRIVATE HEALTH INSURANCE

## 2024-04-11 DIAGNOSIS — G4730 Sleep apnea, unspecified: Principal | ICD-10-CM

## 2024-04-11 DIAGNOSIS — G4733 Obstructive sleep apnea (adult) (pediatric): Secondary | ICD-10-CM

## 2024-04-12 ENCOUNTER — Encounter: Admit: 2024-04-12 | Discharge: 2024-04-12 | Payer: PRIVATE HEALTH INSURANCE

## 2024-04-12 NOTE — Telephone Encounter [36]
 DME: [facility] Fax: [phone]      Faxed CPAP order : CPAP 10-15cm H20 , order, OVN 04/10/2024, SS dated 02/03/2024 and 2016 with demographics faxed to DME.    Fax successful.      Reminders sent for f/u.

## 2024-04-19 ENCOUNTER — Encounter: Admit: 2024-04-19 | Discharge: 2024-04-19 | Payer: PRIVATE HEALTH INSURANCE

## 2024-04-27 ENCOUNTER — Encounter: Admit: 2024-04-27 | Discharge: 2024-04-27

## 2024-04-27 DIAGNOSIS — G43009 Migraine without aura, not intractable, without status migrainosus: Principal | ICD-10-CM

## 2024-04-27 MED ORDER — AMITRIPTYLINE 100 MG PO TAB
200 mg | ORAL_TABLET | Freq: Every evening | ORAL | 0 refills | 30.00000 days | Status: AC
Start: 2024-04-27 — End: ?

## 2024-05-09 ENCOUNTER — Encounter: Admit: 2024-05-09 | Discharge: 2024-05-09

## 2024-05-11 ENCOUNTER — Encounter: Admit: 2024-05-11 | Discharge: 2024-05-11

## 2024-05-14 ENCOUNTER — Encounter: Admit: 2024-05-14 | Discharge: 2024-05-14

## 2024-05-16 ENCOUNTER — Encounter: Admit: 2024-05-16 | Discharge: 2024-05-16
